# Patient Record
Sex: Male | Born: 1965 | Race: White | Hispanic: No | Marital: Married
Health system: Midwestern US, Community
[De-identification: ages and names within clinical notes are randomized; demographics above are authoritative.]

## PROBLEM LIST (undated history)

## (undated) DIAGNOSIS — F32A Depression, unspecified: Secondary | ICD-10-CM

## (undated) DIAGNOSIS — G4730 Sleep apnea, unspecified: Secondary | ICD-10-CM

## (undated) DIAGNOSIS — R011 Cardiac murmur, unspecified: Secondary | ICD-10-CM

## (undated) DIAGNOSIS — M545 Low back pain, unspecified: Secondary | ICD-10-CM

## (undated) DIAGNOSIS — R51 Headache: Secondary | ICD-10-CM

## (undated) DIAGNOSIS — M1990 Unspecified osteoarthritis, unspecified site: Secondary | ICD-10-CM

## (undated) DIAGNOSIS — I10 Essential (primary) hypertension: Secondary | ICD-10-CM

## (undated) DIAGNOSIS — M5410 Radiculopathy, site unspecified: Secondary | ICD-10-CM

## (undated) DIAGNOSIS — H9192 Unspecified hearing loss, left ear: Secondary | ICD-10-CM

## (undated) DIAGNOSIS — F419 Anxiety disorder, unspecified: Secondary | ICD-10-CM

## (undated) DIAGNOSIS — R519 Headache, unspecified: Secondary | ICD-10-CM

## (undated) DIAGNOSIS — Z8489 Family history of other specified conditions: Secondary | ICD-10-CM

## (undated) DIAGNOSIS — Z9889 Other specified postprocedural states: Secondary | ICD-10-CM

## (undated) DIAGNOSIS — R112 Nausea with vomiting, unspecified: Secondary | ICD-10-CM

## (undated) DIAGNOSIS — F329 Major depressive disorder, single episode, unspecified: Secondary | ICD-10-CM

## (undated) DIAGNOSIS — S83242A Other tear of medial meniscus, current injury, left knee, initial encounter: Secondary | ICD-10-CM

## (undated) HISTORY — DX: Radiculopathy, site unspecified: M54.10

## (undated) HISTORY — PX: BILATERAL KNEE ARTHROSCOPY: SUR91

## (undated) HISTORY — PX: BACK SURGERY: SHX140

## (undated) HISTORY — DX: Low back pain, unspecified: M54.50

## (undated) HISTORY — PX: CERVICAL SPINE SURGERY: SHX589

## (undated) HISTORY — PX: OTHER SURGICAL HISTORY: SHX169

---
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
## 2016-12-28 ENCOUNTER — Ambulatory Visit

## 2016-12-28 ENCOUNTER — Inpatient Hospital Stay

## 2016-12-28 LAB — POTASSIUM: Potassium: 3.5 mmol/L (ref 3.5–5.3)

## 2016-12-28 MED ORDER — SODIUM CHLORIDE 0.9 % IJ SYRG
INTRAMUSCULAR | Status: DC | PRN
Start: 2016-12-28 — End: 2016-12-28

## 2016-12-28 MED ORDER — PROPOFOL 10 MG/ML IV EMUL
10 mg/mL | INTRAVENOUS | Status: AC
Start: 2016-12-28 — End: ?

## 2016-12-28 MED ORDER — FENTANYL CITRATE (PF) 50 MCG/ML IJ SOLN
50 mcg/mL | INTRAMUSCULAR | Status: DC | PRN
Start: 2016-12-28 — End: 2016-12-28

## 2016-12-28 MED ORDER — ONDANSETRON (PF) 4 MG/2 ML INJECTION
4 mg/2 mL | Freq: Once | INTRAMUSCULAR | Status: DC
Start: 2016-12-28 — End: 2016-12-28

## 2016-12-28 MED ORDER — LIDOCAINE (PF) 20 MG/ML (2 %) IV SYRINGE
100 mg/5 mL (2 %) | INTRAVENOUS | Status: AC
Start: 2016-12-28 — End: ?

## 2016-12-28 MED ORDER — LACTATED RINGERS IV
INTRAVENOUS | Status: DC
Start: 2016-12-28 — End: 2016-12-28
  Administered 2016-12-28: 16:00:00 via INTRAVENOUS

## 2016-12-28 MED ORDER — MIDAZOLAM 1 MG/ML IJ SOLN
1 mg/mL | INTRAMUSCULAR | Status: AC
Start: 2016-12-28 — End: ?

## 2016-12-28 MED ORDER — FENTANYL CITRATE (PF) 50 MCG/ML IJ SOLN
50 mcg/mL | INTRAMUSCULAR | Status: DC | PRN
Start: 2016-12-28 — End: 2016-12-28
  Administered 2016-12-28 (×4): via INTRAVENOUS

## 2016-12-28 MED ORDER — FENTANYL CITRATE (PF) 50 MCG/ML IJ SOLN
50 mcg/mL | INTRAMUSCULAR | Status: AC
Start: 2016-12-28 — End: ?

## 2016-12-28 MED ORDER — SODIUM CHLORIDE 0.9 % IV PIGGY BACK
1 gram | Freq: Once | INTRAVENOUS | Status: AC
Start: 2016-12-28 — End: 2016-12-28
  Administered 2016-12-28: 19:00:00 via INTRAVENOUS

## 2016-12-28 MED ORDER — TRIAMCINOLONE ACETONIDE 40 MG/ML SUSP FOR INJECTION
40 mg/mL | INTRAMUSCULAR | Status: DC | PRN
Start: 2016-12-28 — End: 2016-12-28
  Administered 2016-12-28: 19:00:00

## 2016-12-28 MED ORDER — SODIUM CHLORIDE 0.9 % IRRIGATION SOLN (ONE-STEP) - 3000 ML
0.9 % | Status: DC | PRN
Start: 2016-12-28 — End: 2016-12-28
  Administered 2016-12-28: 19:00:00

## 2016-12-28 MED ORDER — BUPIVACAINE-EPINEPHRINE (PF) 0.5 %-1:200,000 IJ SOLN
0.5 %-1:200,000 | INTRAMUSCULAR | Status: DC | PRN
Start: 2016-12-28 — End: 2016-12-28
  Administered 2016-12-28: 19:00:00

## 2016-12-28 MED ORDER — SODIUM CHLORIDE 0.9 % IJ SYRG
Freq: Three times a day (TID) | INTRAMUSCULAR | Status: DC
Start: 2016-12-28 — End: 2016-12-28

## 2016-12-28 MED ORDER — PROPOFOL 10 MG/ML IV EMUL
10 mg/mL | INTRAVENOUS | Status: DC | PRN
Start: 2016-12-28 — End: 2016-12-28
  Administered 2016-12-28: 19:00:00 via INTRAVENOUS

## 2016-12-28 MED ORDER — LIDOCAINE (PF) 20 MG/ML (2 %) IV SYRINGE
100 mg/5 mL (2 %) | INTRAVENOUS | Status: DC | PRN
Start: 2016-12-28 — End: 2016-12-28
  Administered 2016-12-28: 19:00:00 via INTRAVENOUS

## 2016-12-28 MED ORDER — MIDAZOLAM 1 MG/ML IJ SOLN
1 mg/mL | Freq: Once | INTRAMUSCULAR | Status: AC
Start: 2016-12-28 — End: 2016-12-28
  Administered 2016-12-28: 19:00:00 via INTRAVENOUS

## 2016-12-28 MED FILL — DIPRIVAN 10 MG/ML INTRAVENOUS EMULSION: 10 mg/mL | INTRAVENOUS | Qty: 20

## 2016-12-28 MED FILL — CEFAZOLIN 1 GRAM IV SOLUTION: 1 gram | INTRAVENOUS | Qty: 1000

## 2016-12-28 MED FILL — NORMAL SALINE FLUSH 0.9 % INJECTION SYRINGE: INTRAMUSCULAR | Qty: 10

## 2016-12-28 MED FILL — LACTATED RINGERS IV: INTRAVENOUS | Qty: 1000

## 2016-12-28 MED FILL — MIDAZOLAM 1 MG/ML IJ SOLN: 1 mg/mL | INTRAMUSCULAR | Qty: 2

## 2016-12-28 MED FILL — FENTANYL CITRATE (PF) 50 MCG/ML IJ SOLN: 50 mcg/mL | INTRAMUSCULAR | Qty: 2

## 2016-12-28 MED FILL — LIDOCAINE (PF) 20 MG/ML (2 %) IV SYRINGE: 100 mg/5 mL (2 %) | INTRAVENOUS | Qty: 5

## 2016-12-28 NOTE — Op Note (Signed)
Operative Note      Patient: Riley AlpersChristopher Dusenbury               Sex: male          DOA: @INPADMDT @       Date of Birth:  11/26/1965      Age:  51 y.o.        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]    Postoperative Diagnosis:  TORNMEDIAL AND LATERAL MENISCUS, SYNOVITIS, CHONDROMALACIA    Surgeon: Surgeon(s) and Role:     * Violeta GelinasLaura C Marijo Quizon, DO - Primary    Anesthesia:  General    Procedure:  Procedure(s):  KNEE ARTHROSCOPY LEFT, PARTIAL MEDIAL AND LATERAL MENISECTOMY, SYNOVECTOMY, CHONDROPLASTY      Procedure in Detail: see report    Estimated Blood Loss:  5           Tourniquet Time:  17           Implants: * No implants in log *    Specimens:   ID Type Source Tests Collected by Time Destination   1 : LEFT KNEE SHAVINGS Preservative Other                  Violeta GelinasLaura C Vernel Donlan, DO 12/28/2016 1415 Pathology        Drains: None           Complications:  None           Counts: Sponge and needle counts were correct times two.

## 2016-12-28 NOTE — Op Note (Signed)
Operative Note      Patient: Riley Acosta               Sex: male          DOA: @INPADMDT@       Date of Birth:  08/20/1966      Age:  50 y.o.        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]    Postoperative Diagnosis:  TORNMEDIAL AND LATERAL MENISCUS, SYNOVITIS, CHONDROMALACIA    Surgeon: Surgeon(s) and Role:     * Kaimana Lurz C Oluwaseyi Raffel, DO - Primary    Anesthesia:  General    Procedure:  Procedure(s):  KNEE ARTHROSCOPY LEFT, PARTIAL MEDIAL AND LATERAL MENISECTOMY, SYNOVECTOMY, CHONDROPLASTY      Procedure in Detail: see report    Estimated Blood Loss:  5           Tourniquet Time:  17           Implants: * No implants in log *    Specimens:   ID Type Source Tests Collected by Time Destination   1 : LEFT KNEE SHAVINGS Preservative Other                  Folasade Mooty C Jacorion Klem, DO 12/28/2016 1415 Pathology        Drains: None           Complications:  None           Counts: Sponge and needle counts were correct times two.

## 2016-12-28 NOTE — Anesthesia Pre-Procedure Evaluation (Signed)
Anesthetic History   No history of anesthetic complications            Review of Systems / Medical History  Patient summary reviewed, nursing notes reviewed and pertinent labs reviewed    Pulmonary        Sleep apnea           Neuro/Psych   Within defined limits           Cardiovascular    Hypertension: well controlled              Exercise tolerance: >4 METS     GI/Hepatic/Renal  Within defined limits              Endo/Other  Within defined limits           Other Findings              Physical Exam    Airway  Mallampati: II  TM Distance: 4 - 6 cm  Neck ROM: normal range of motion   Mouth opening: Normal     Cardiovascular  Regular rate and rhythm,  S1 and S2 normal,  no murmur, click, rub, or gallop  Rhythm: regular  Rate: normal         Dental  No notable dental hx       Pulmonary  Breath sounds clear to auscultation               Abdominal  GI exam deferred       Other Findings            Anesthetic Plan    ASA: 2  Anesthesia type: general          Induction: Intravenous  Anesthetic plan and risks discussed with: Patient

## 2016-12-28 NOTE — Op Note (Signed)
[facility]  OPERATIVE REPORT    [name], Riley  MR#: [id number]  DOB: [birth year]  ACCOUNT #: [id number]   DATE OF SERVICE: 12/28/2016    PREOPERATIVE DIAGNOSIS: Left knee internal derangement with synovitis and torn meniscus.    POSTOPERATIVE DIAGNOSIS:  Left knee internal derangement with loose bodies medial compartment, chondromalacia, torn posterior horn medial meniscus, torn anterior horn mid portion lateral meniscus.    PROCEDURES PERFORMED: Left knee diagnostic and operative arthroscopy with synovectomy and patellofemoral medial, lateral, and anterior compartments, partial medial meniscectomy and posterior horn removal of loose bodies under no separate incision, medial compartment debridement of a large lateral meniscal tear, anterior horn and mid portion, chondroplasty to bleeding subchondral bone/patella.    SURGEON:  Annitta JerseyLaura Daizha Anand, DO     ANESTHESIA:  General, local.    ASSISTANT:  None.    IMPLANTS:  None.    DRAINS:  None.    INDICATIONS:  A 51 year old with persistent pain, swelling and giving out of the knee, failed conservative treatment.  Benefits and risks of surgery to include but not limited to bleeding, infection, neurovascular damage, arthrofibrosis, DVT, PE, incisional pain, numbness and/or neuroma formation, persistent, altered or increased symptoms, potential need for further surgical intervention as well as postoperative rehab time were reviewed with the patient, questions answered and proper consents were signed.       DESCRIPTION OF PROCEDURE:  The patient was taken to the operating room and properly identified.  The patient was given anesthesia as described above.  The left lower extremity was prepped and draped in the usual sterile fashion.  Standard arthroscopic portals were used.       Patellofemoral joint:  Patellofemoral joint with some grade III and IV chondromalacia in the undersurface of the patella, which was tracking well  in trochlear groove.   Hemorrhagic synovium suprapatellar pouch. Procedure performed: Chondroplasty to bleeding subchondral bone and synovectomy was performed in this compartment.     Medial compartment:  Medial compartment with tear of the posterior horn under the medial meniscus.  Grade II and III chondromalacia along the medial tibial plateau.  Loose bodies or fragments of articular cartilage floating in the joint.  Procedure performed:  Partial medial meniscectomy, soft chondroplasty and removal of loose bodies with no separate incision.      Notch:  The notch was covered with synovium.  There was some narrowing of the notch.  ACL and PCL were intact.  Procedure performed in the notch: Synovectomy.     Lateral compartment:  Lateral compartment with a complex degenerative tear from the 3:00 to the 6:30 position of the lateral meniscus, grade II chondromalacia and synovitis.  Procedure performed:  Partial lateral meniscectomy, soft chondroplasty and synovectomy.  Steroid was injected.    The knee was irrigated and drained through the scope.  Pre and postoperative photos were taken.  All remaining articular cartilage and meniscal tissue was probed and found to be stable.  Portals closed with 4-0 Nylon and injected with 0.25% Marcaine with Epinephrine.  The knee was injected with 20 mL of 0.25% Marcaine with Epinephrine without 1 mL of Kenalog.  Betadine soaked Adaptic 4X4's, ABD's and sterile dressing were applied.  The patient was transferred to the Recovery Room in stable condition.     COMPLICATIONS:  None.    ESTIMATED BLOOD LOSS:    SPECIMENS REMOVED:      Christine Morton, D.O.       LR / HN  D: 12/30/2016 12:56  T: 12/30/2016 14:16  JOB #: [id number]

## 2016-12-28 NOTE — Anesthesia Post-Procedure Evaluation (Signed)
Post-Anesthesia Evaluation and Assessment    Patient: Riley AlpersChristopher Acosta MRN: [id number]  SSN: [id number]    Date of Birth: 07/21/1966  Age: 51 y.o.  Sex: male       Cardiovascular Function/Vital Signs  Visit Vitals   ??? BP 131/85   ??? Pulse 88   ??? Temp 36.5 ??C (97.7 ??F)   ??? Resp 17   ??? Ht 5\' 9"  (1.753 m)   ??? Wt 92.5 kg (204 lb)   ??? SpO2 100%   ??? BMI 30.13 kg/m2       Patient is status post general anesthesia for Procedure(s):  KNEE ARTHROSCOPY LEFT, PARTIAL MEDIAL AND LATERAL MENISECTOMY, SYNOVECTOMY, CHONDROPLASTY .    Nausea/Vomiting: None    Postoperative hydration reviewed and adequate.    Pain:  Pain Scale 1: Visual (12/28/16 1430)  Pain Intensity 1: 0 (12/28/16 1430)   Managed    Neurological Status:   Neuro (WDL): Within Defined Limits (12/28/16 1033)   At baseline    Mental Status and Level of Consciousness: Arousable    Pulmonary Status:   O2 Device: Oxygen mask;Oral airway (12/28/16 1432)   Adequate oxygenation and airway patent    Complications related to anesthesia: None    Post-anesthesia assessment completed. No concerns    Signed By: Brayton CavesAshley B Davianna Deutschman, MD     December 28, 2016

## 2016-12-28 NOTE — Op Note (Signed)
OUR [facility]  OPERATIVE REPORT    LONNEY, REVAK  MR#: [id number]  DOB: [birth year]  ACCOUNT #: [id number]   DATE OF SERVICE: 12/28/2016    PREOPERATIVE DIAGNOSIS: Left knee internal derangement with synovitis and torn meniscus.    POSTOPERATIVE DIAGNOSIS:  Left knee internal derangement with loose bodies medial compartment, chondromalacia, torn posterior horn medial meniscus, torn anterior horn mid portion lateral meniscus.    PROCEDURES PERFORMED: Left knee diagnostic and operative arthroscopy with synovectomy and patellofemoral medial, lateral, and anterior compartments, partial medial meniscectomy and posterior horn removal of loose bodies under no separate incision, medial compartment debridement of a large lateral meniscal tear, anterior horn and mid portion, chondroplasty to bleeding subchondral bone/patella.    SURGEON:  Annitta Jersey, DO     ANESTHESIA:  General, local.    ASSISTANT:  None.    IMPLANTS:  None.    DRAINS:  None.    INDICATIONS:  A 51 year old with persistent pain, swelling and giving out of the knee, failed conservative treatment.  Benefits and risks of surgery to include but not limited to bleeding, infection, neurovascular damage, arthrofibrosis, DVT, PE, incisional pain, numbness and/or neuroma formation, persistent, altered or increased symptoms, potential need for further surgical intervention as well as postoperative rehab time were reviewed with the patient, questions answered and proper consents were signed.       DESCRIPTION OF PROCEDURE:  The patient was taken to the operating room and properly identified.  The patient was given anesthesia as described above.  The left lower extremity was prepped and draped in the usual sterile fashion.  Standard arthroscopic portals were used.       Patellofemoral joint:  Patellofemoral joint with some grade III and IV chondromalacia in the undersurface of the patella, which was tracking well in trochlear groove.    Hemorrhagic synovium suprapatellar pouch. Procedure performed: Chondroplasty to bleeding subchondral bone and synovectomy was performed in this compartment.     Medial compartment:  Medial compartment with tear of the posterior horn under the medial meniscus.  Grade II and III chondromalacia along the medial tibial plateau.  Loose bodies or fragments of articular cartilage floating in the joint.  Procedure performed:  Partial medial meniscectomy, soft chondroplasty and removal of loose bodies with no separate incision.      Notch:  The notch was covered with synovium.  There was some narrowing of the notch.  ACL and PCL were intact.  Procedure performed in the notch: Synovectomy.     Lateral compartment:  Lateral compartment with a complex degenerative tear from the 3:00 to the 6:30 position of the lateral meniscus, grade II chondromalacia and synovitis.  Procedure performed:  Partial lateral meniscectomy, soft chondroplasty and synovectomy.  Steroid was injected.    The knee was irrigated and drained through the scope.  Pre and postoperative photos were taken.  All remaining articular cartilage and meniscal tissue was probed and found to be stable.  Portals closed with 4-0 Nylon and injected with 0.25% Marcaine with Epinephrine.  The knee was injected with 20 mL of 0.25% Marcaine with Epinephrine without 1 mL of Kenalog.  Betadine soaked Adaptic 4X4's, ABD's and sterile dressing were applied.  The patient was transferred to the Recovery Room in stable condition.     COMPLICATIONS:  None.    ESTIMATED BLOOD LOSS:    SPECIMENS REMOVED:      Destry Bezdek, D.O.       LR / HN  D: 12/30/2016 12:56  T: 12/30/2016 14:16  JOB #: [id number]

## 2017-04-02 ENCOUNTER — Encounter: Payer: Self-pay | Admitting: Gastroenterology

## 2017-04-26 ENCOUNTER — Ambulatory Visit (AMBULATORY_SURGERY_CENTER): Payer: Self-pay

## 2017-04-26 VITALS — Ht 68.5 in | Wt 207.8 lb

## 2017-04-26 DIAGNOSIS — Z1211 Encounter for screening for malignant neoplasm of colon: Secondary | ICD-10-CM

## 2017-04-26 MED ORDER — SUPREP BOWEL PREP KIT 17.5-3.13-1.6 GM/177ML PO SOLN: 1.0000 | Freq: Once | ORAL | 0 refills | Status: AC

## 2017-04-26 NOTE — Progress Notes (Signed)
No allergies to eggs or soy No diet meds No home xoygen No past problems with anesthesia  Registered emmi

## 2017-04-27 ENCOUNTER — Encounter: Payer: Self-pay | Admitting: *Deleted

## 2017-04-27 ENCOUNTER — Telehealth: Payer: Self-pay | Admitting: Gastroenterology

## 2017-04-27 NOTE — Telephone Encounter (Signed)
Spoke with patient and I will send him the letter for the [place] today.

## 2017-04-28 ENCOUNTER — Telehealth: Payer: Self-pay | Admitting: Gastroenterology

## 2017-04-28 DIAGNOSIS — Z1211 Encounter for screening for malignant neoplasm of colon: Secondary | ICD-10-CM

## 2017-05-04 MED ORDER — NA SULFATE-K SULFATE-MG SULF 17.5-3.13-1.6 GM/177ML PO SOLN: 1.0000 | Freq: Once | ORAL | 0 refills | Status: DC

## 2017-05-04 MED ORDER — NA SULFATE-K SULFATE-MG SULF 17.5-3.13-1.6 GM/177ML PO SOLN: 1.0000 | Freq: Once | ORAL | 0 refills | Status: AC

## 2017-05-04 NOTE — Telephone Encounter (Signed)
Spoke with pt. He is requesting a letter for the [place] showing he was seen in our office for a nurse's visit on 04/26/17. He states he is also having trouble getting the Suprep Rx. The pt will come to our office today to pick up the letter for the [facility] hospital and a Suprep sample will be left for the pt to pick up. Suprep sample, Lot # [id number], Exp 05/20 and a letter were left at the front desk on the 4th floor for the pt to pick up. He will call back if he has further questions.

## 2017-05-04 NOTE — Telephone Encounter (Signed)
Patient states that pharmacy did not receive rx. Patient is requesting this Rx to be mailed to him.   Patient also states that the [place] is needing proof that patient came to his pre visit appointment.

## 2017-05-10 ENCOUNTER — Ambulatory Visit (AMBULATORY_SURGERY_CENTER): Payer: Non-veteran care | Admitting: Gastroenterology

## 2017-05-10 ENCOUNTER — Encounter: Payer: Self-pay | Admitting: Gastroenterology

## 2017-05-10 VITALS — BP 125/75 | HR 72 | Temp 98.9°F | Resp 13 | Ht 68.5 in | Wt 207.0 lb

## 2017-05-10 DIAGNOSIS — Z1211 Encounter for screening for malignant neoplasm of colon: Secondary | ICD-10-CM

## 2017-05-10 DIAGNOSIS — Z1212 Encounter for screening for malignant neoplasm of rectum: Secondary | ICD-10-CM | POA: Diagnosis not present

## 2017-05-10 DIAGNOSIS — D123 Benign neoplasm of transverse colon: Secondary | ICD-10-CM

## 2017-05-10 DIAGNOSIS — K635 Polyp of colon: Secondary | ICD-10-CM | POA: Insufficient documentation

## 2017-05-10 MED ORDER — SODIUM CHLORIDE 0.9 % IV SOLN
500.0000 mL | INTRAVENOUS | Status: DC
Start: 2017-05-10 — End: 2017-07-16

## 2017-05-10 NOTE — Patient Instructions (Signed)
YOU HAD AN ENDOSCOPIC PROCEDURE TODAY AT THE [facility]:   Refer to the procedure report that was given to you for any specific questions about what was found during the examination.  If the procedure report does not answer your questions, please call your gastroenterologist to clarify.  If you requested that your care partner not be given the details of your procedure findings, then the procedure report has been included in a sealed envelope for you to review at your convenience later.  YOU SHOULD EXPECT: Some feelings of bloating in the abdomen. Passage of more gas than usual.  Walking can help get rid of the air that was put into your GI tract during the procedure and reduce the bloating. If you had a lower endoscopy (such as a colonoscopy or flexible sigmoidoscopy) you may notice spotting of blood in your stool or on the toilet paper. If you underwent a bowel prep for your procedure, you may not have a normal bowel movement for a few days.  Please Note:  You might notice some irritation and congestion in your nose or some drainage.  This is from the oxygen used during your procedure.  There is no need for concern and it should clear up in a day or so.  SYMPTOMS TO REPORT IMMEDIATELY:   Following lower endoscopy (colonoscopy or flexible sigmoidoscopy):  Excessive amounts of blood in the stool  Significant tenderness or worsening of abdominal pains  Swelling of the abdomen that is new, acute  Fever of 100F or higher    For urgent or emergent issues, a gastroenterologist can be reached at any hour by calling [phone].   DIET:  We do recommend a small meal at first, but then you may proceed to your regular diet.  Drink plenty of fluids but you should avoid alcoholic beverages for 24 hours.  ACTIVITY:  You should plan to take it easy for the rest of today and you should NOT DRIVE or use heavy machinery until tomorrow (because of the sedation medicines used during the test).     FOLLOW UP: Our staff will call the number listed on your records the next business day following your procedure to check on you and address any questions or concerns that you may have regarding the information given to you following your procedure. If we do not reach you, we will leave a message.  However, if you are feeling well and you are not experiencing any problems, there is no need to return our call.  We will assume that you have returned to your regular daily activities without incident.  If any biopsies were taken you will be contacted by phone or by letter within the next 1-3 weeks.  Please call us at [phone] if you have not heard about the biopsies in 3 weeks.    SIGNATURES/CONFIDENTIALITY: You and/or your care partner have signed paperwork which will be entered into your electronic medical record.  These signatures attest to the fact that that the information above on your After Visit Summary has been reviewed and is understood.  Full responsibility of the confidentiality of this discharge information lies with you and/or your care-partner.    Handouts were given to your care partner on polyps and diverticulosis. You may resume your current medications today. Await biopsy results. Please call if any questions or concerns.   

## 2017-05-10 NOTE — Progress Notes (Signed)
Report given to PACU, vss 

## 2017-05-10 NOTE — Op Note (Signed)
[place] [name] Procedure Date: 05/10/2017 2:35 PM MRN: [id number] Endoscopist: Mallie Mussel L. Loletha Carrow , MD Age: 51 Referring MD:  Date of Birth: 09-22-1966 Gender: Male Account #: [id number] Procedure:                Colonoscopy Indications:              Screening for colorectal malignant neoplasm, This                            is the patient's first colonoscopy Medicines:                Monitored Anesthesia Care Procedure:                Pre-Anesthesia Assessment:                           - Prior to the procedure, a History and Physical                            was performed, and patient medications and                            allergies were reviewed. The patient's tolerance of                            previous anesthesia was also reviewed. The risks                            and benefits of the procedure and the sedation                            options and risks were discussed with the patient.                            All questions were answered, and informed consent                            was obtained. Prior Anticoagulants: The patient has                            taken no previous anticoagulant or antiplatelet                            agents. ASA Grade Assessment: II - A patient with                            mild systemic disease. After reviewing the risks                            and benefits, the patient was deemed in                            satisfactory condition to undergo the procedure.  After obtaining informed consent, the colonoscope                            was passed under direct vision. Throughout the                            procedure, the patient's blood pressure, pulse, and                            oxygen saturations were monitored continuously. The                            Colonoscope was introduced through the anus and                            advanced to the the  cecum, identified by                            appendiceal orifice and ileocecal valve. The                            colonoscopy was performed without difficulty. The                            patient tolerated the procedure well. The quality                            of the bowel preparation was good. The ileocecal                            valve, appendiceal orifice, and rectum were                            photographed. The quality of the bowel preparation                            was evaluated using the BBPS [facility] Bowel                            Preparation Scale) with scores of: Right Colon = 2,                            Transverse Colon = 2 and Left Colon = 2. The total                            BBPS score equals 6. The bowel preparation used was                            SUPREP. Scope In: 2:46:34 PM Scope Out: 3:01:00 PM Scope Withdrawal Time: 0 hours 10 minutes 58 seconds  Total Procedure Duration: 0 hours 14 minutes 26 seconds  Findings:                 The perianal and digital rectal  examinations were                            normal.                           A 2 mm polyp was found in the hepatic flexure. The                            polyp was sessile. The polyp was removed with a                            cold snare. Resection and retrieval were complete.                           Multiple medium-mouthed diverticula were found in                            the left colon and right colon.                           The exam was otherwise without abnormality on                            direct and retroflexion views. Complications:            No immediate complications. Estimated Blood Loss:     Estimated blood loss: none. Impression:               - One 2 mm polyp at the hepatic flexure, removed                            with a cold snare. Resected and retrieved.                           - Diverticulosis in the left colon and in the right                             colon.                           - The examination was otherwise normal on direct                            and retroflexion views. Recommendation:           - Patient has a contact number available for                            emergencies. The signs and symptoms of potential                            delayed complications were discussed with the                            patient. Return to normal activities tomorrow.  Written discharge instructions were provided to the                            patient.                           - Resume previous diet.                           - Continue present medications.                           - Await pathology results.                           - Repeat colonoscopy is recommended for                            surveillance. The colonoscopy date will be                            determined after pathology results from today's                            exam become available for review. Markitta Ausburn L. Loletha Carrow, MD 05/10/2017 3:03:54 PM This report has been signed electronically.

## 2017-05-10 NOTE — Progress Notes (Signed)
Pt's states no medical or surgical changes since previsit or office visit. 

## 2017-05-10 NOTE — Progress Notes (Signed)
No problems noted in the recovery room. maw 

## 2017-05-11 ENCOUNTER — Telehealth: Payer: Self-pay | Admitting: *Deleted

## 2017-05-11 NOTE — Telephone Encounter (Signed)
  Follow up Call-  Call back number 05/10/2017  Post procedure Call Back phone  # [phone]  Permission to leave phone message Yes     Patient questions:  Do you have a fever, pain , or abdominal swelling? No. Pain Score  0 *  Have you tolerated food without any problems? Yes.    Have you been able to return to your normal activities? Yes.    Do you have any questions about your discharge instructions: Diet   No. Medications  No. Follow up visit  No.  Do you have questions or concerns about your Care? No.  Actions: * If pain score is 4 or above: No action needed, pain <4.

## 2017-05-13 ENCOUNTER — Encounter: Payer: Self-pay | Admitting: Gastroenterology

## 2017-05-27 ENCOUNTER — Other Ambulatory Visit: Payer: Self-pay | Admitting: Specialist

## 2017-05-27 DIAGNOSIS — M5136 Other intervertebral disc degeneration, lumbar region: Secondary | ICD-10-CM

## 2017-06-08 ENCOUNTER — Other Ambulatory Visit: Payer: No Typology Code available for payment source

## 2017-06-08 NOTE — Discharge Instructions (Signed)

## 2017-06-10 ENCOUNTER — Ambulatory Visit
Admission: RE | Admit: 2017-06-10 | Discharge: 2017-06-10 | Disposition: A | Discharge status: Home / Self Care | Payer: Self-pay | Source: Ambulatory Visit | Attending: Specialist | Admitting: Specialist

## 2017-06-10 ENCOUNTER — Other Ambulatory Visit: Payer: Self-pay | Admitting: Specialist

## 2017-06-10 ENCOUNTER — Ambulatory Visit
Admission: RE | Admit: 2017-06-10 | Discharge: 2017-06-10 | Disposition: A | Discharge status: Home / Self Care | Payer: No Typology Code available for payment source | Source: Ambulatory Visit | Attending: Specialist | Admitting: Specialist

## 2017-06-10 DIAGNOSIS — M5136 Other intervertebral disc degeneration, lumbar region: Secondary | ICD-10-CM

## 2017-06-10 MED ORDER — IOPAMIDOL (ISOVUE-M 200) INJECTION 41%
15.0000 mL | Freq: Once | INTRAMUSCULAR | Status: AC
  Administered 2017-06-10: 15 mL via INTRATHECAL

## 2017-06-10 MED ORDER — ONDANSETRON HCL 4 MG/2ML IJ SOLN
4.0000 mg | Freq: Once | INTRAMUSCULAR | Status: AC
  Administered 2017-06-10: 4 mg via INTRAMUSCULAR

## 2017-06-10 MED ORDER — MEPERIDINE HCL 100 MG/ML IJ SOLN
75.0000 mg | Freq: Once | INTRAMUSCULAR | Status: AC
  Administered 2017-06-10: 75 mg via INTRAMUSCULAR

## 2017-06-10 MED ORDER — DIAZEPAM 5 MG PO TABS
10.0000 mg | ORAL_TABLET | Freq: Once | ORAL | Status: AC
  Administered 2017-06-10: 10 mg via ORAL

## 2017-06-17 ENCOUNTER — Ambulatory Visit: Payer: Self-pay | Admitting: Orthopedic Surgery

## 2017-06-17 NOTE — H&P (Signed)
[name] is an 51 y.o. male.   Chief Complaint: back and leg pain HPI: The patient is a 51 year old male who presents today for follow up of their back. The patient is being followed for their central back pain (pain radiates down both legs, further down the left leg). They are now year(s) out from injury. Symptoms reported today include: pain. The patient states that they are doing poorly. The following medication has been used for pain control: none. The patient reports their current pain level to be severe (varies from mild to severe, usually averages around a 7). The patient presents today following myelogram. Note for "Follow-up back": Had alot of discomfort in low back and bilat knees after the Myelogram.  [name] reports bilateral leg pain, multi-year duration. He has to sit for relief. He has limited ambulatory capacity, less than a block, both legs. Has to sit for relief. He has a myelogram and is here for review. He has minimal back pain, mainly buttock and leg pain.  No change in bowel or bladder function.  No past medical history on file.  Past Surgical History:  Procedure Laterality Date  . BILATERAL KNEE ARTHROSCOPY    . c5-6     removal of fusion and spacers  . CERVICAL SPINE SURGERY     2015,OCTOBER    Family History  Problem Relation Age of Onset  . Hepatitis Mother   . Colon cancer Neg Hx    Social History:  reports that he has quit smoking. His smoking use included Cigarettes. He has never used smokeless tobacco. He reports that he drinks about 8.4 oz of alcohol per week . He reports that he does not use drugs.  Allergies:  Allergies  Allergen Reactions  . Lisinopril Swelling and Rash  . Tramadol Other (See Comments)    "complete blackout;" can't remember a thing     (Not in a hospital admission)  No results found for this or any previous visit (from the past 48 hour(s)). No results found.  Review of Systems  Constitutional:  Negative.   HENT: Negative.   Eyes: Negative.   Respiratory: Negative.   Cardiovascular: Negative.   Gastrointestinal: Negative.   Genitourinary: Negative.   Musculoskeletal: Positive for back pain.  Skin: Negative.   Neurological: Positive for sensory change and focal weakness.  Psychiatric/Behavioral: Negative.     There were no vitals taken for this visit. Physical Exam  Constitutional: He is oriented to person, place, and time. He appears well-developed. He appears distressed.  HENT:  Head: Normocephalic.  Eyes: Pupils are equal, round, and reactive to light.  Neck: Normal range of motion.  Cardiovascular: Normal rate.   Respiratory: Effort normal.  GI: Soft.  Musculoskeletal:  On exam, moderate distress. He walks with an antalgic forward-flexed gait with a cane. He has bilateral knee braces on. Straight leg raise with buttock, thigh and calf pain on the left, buttock pain on the right. He has 4+/5 EHL on the left, 5-/5 on the right. Slight quad weakness left compared to the right. Diminished Achilles reflex on the left compared to the right. Dysesthesia in the plantar aspect of the foot. Diminished repetitive plantar flexion.  Lumbar spine exam reveals no evidence of soft tissue swelling, deformity or skin ecchymosis. On palpation there is no tenderness of the lumbar spine. No flank pain with percussion. The abdomen is soft and nontender. Nontender over the trochanters. No cellulitis or lymphadenopathy.  Good range of motion of the  lumbar spine without associated pain. Straight leg raise is negative. Motor is 5/5 including EHL, tibialis anterior, plantar flexion, quadriceps and hamstrings. Patient is normoreflexic. There is no Babinski or clonus. Sensory exam is intact to light touch. Patient has good distal pulses. No DVT. No pain and normal range of motion without instability of the hips, knees and ankles.  Cervical spine, he has some discomfort with end-forward flexion. He is  status post two-level cervical fusion.  Neurological: He is alert and oriented to person, place, and time.    Myelogram was reviewed and it shows severe spinal stenosis at L4-L5. Into L5-S1, he has congenitally short pedicles. He has listhesis at L5 due to the facets but no pars defect. No instability in flexion and extension.  Again, his MRI demonstrates severe stenosis at L4-L5 extending up above near to the pedicle of L4 and below L5.  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 It is offset at L5-S1 but no instability due to the facet arthropathy. He has a tall disc. He is not unstable.  We discussed options; living with the symptoms in forward flexion, [place] program. Surgical options were discussed, I do feel with at least decompression at L4-L5, also at L3-L4 and L5-S1. We will remove the neural arch at L5 and L4. I did not feel that he needs concomitant stabilization, tall disc, not unstable. He has mild degeneration and facet arthropathy. We discussed this is not an operation for back pain but to give him leg pain relief and his claudication and that he may need a fusion in the future. We discussed this in extensive detail. I had an extensive discussion of the risks and benefits of the lumbar decompression with the patient including bleeding, infection, damage to neurovascular structures, epidural fibrosis, CSF leak requiring repair. We also discussed increase in pain, adjacent segment disease, recurrent disc herniation, need for future surgery including repeat decompression and/or fusion. We also discussed risks of postoperative hematoma, paralysis, anesthetic complications including DVT, PE, death, cardiopulmonary dysfunction. In addition, the perioperative and postoperative courses were discussed in detail including the rehabilitative time and  return to functional activity and work. I provided the patient with an illustrated handout and utilized the appropriate surgical models. No history of DVT, MRSA. He is otherwise healthy. Overnight in the hospital. Follow up in three to four weeks.  Plan microlumbar decompression L4-5, possible L3-4, possible L5-S1  Tomeeka Plaugher, Conley Rolls., PA-C for Dr. Tonita Cong 06/17/2017, 11:02 AM

## 2017-07-05 ENCOUNTER — Other Ambulatory Visit (HOSPITAL_COMMUNITY): Payer: Self-pay | Admitting: Emergency Medicine

## 2017-07-05 NOTE — Progress Notes (Signed)
EKG 09-24-17 on chart   [address] PA in [place] 11-25-16 on chart

## 2017-07-05 NOTE — Patient Instructions (Signed)
[name]  07/05/2017   Your procedure is scheduled on: 07-14-17  Report to [facility] Main  Entrance Take [place]  elevators to 3rd floor to  [place] at [place].    Call this number if you have problems the morning of surgery [phone]    Remember: ONLY 1 PERSON MAY GO WITH YOU TO SHORT STAY TO GET  READY MORNING OF [place].  Do not eat food or drink liquids :After Midnight.   Bring CPAP mask and tubing only  to the hospital. The device will be provided for you !   Take these medicines the morning of surgery with A SIP OF WATER: none                                You may not have any metal on your body including hair pins and              piercings  Do not wear jewelry, make-up, lotions, powders or perfumes, deodorant                Men may shave face and neck.   Do not bring valuables to the hospital. [place].  Contacts, dentures or bridgework may not be worn into surgery.  Leave suitcase in the car. After surgery it may be brought to your room.                Please read over the following fact sheets you were given: _____________________________________________________________________    [facility] - Preparing for Surgery Before surgery, you can play an important role.  Because skin is not sterile, your skin needs to be as free of germs as possible.  You can reduce the number of germs on your skin by washing with CHG (chlorahexidine gluconate) soap before surgery.  CHG is an antiseptic cleaner which kills germs and bonds with the skin to continue killing germs even after washing. Please DO NOT use if you have an allergy to CHG or antibacterial soaps.  If your skin becomes reddened/irritated stop using the CHG and inform your nurse when you arrive at Short Stay. Do not shave (including legs and underarms) for at least 48 hours prior to the first CHG shower.  You may shave your  face/neck. Please follow these instructions carefully:  1.  Shower with CHG Soap the night before surgery and the  morning of Surgery.  2.  If you choose to wash your hair, wash your hair first as usual with your  normal  shampoo.  3.  After you shampoo, rinse your hair and body thoroughly to remove the  shampoo.                           4.  Use CHG as you would any other liquid soap.  You can apply chg directly  to the skin and wash                       Gently with a scrungie or clean washcloth.  5.  Apply the CHG Soap to your body ONLY FROM THE NECK DOWN.   Do not use on face/ open  Wound or open sores. Avoid contact with eyes, ears mouth and genitals (private parts).                       Wash face,  Genitals (private parts) with your normal soap.             6.  Wash thoroughly, paying special attention to the area where your surgery  will be performed.  7.  Thoroughly rinse your body with warm water from the neck down.  8.  DO NOT shower/wash with your normal soap after using and rinsing off  the CHG Soap.                9.  Pat yourself dry with a clean towel.            10.  Wear clean pajamas.            11.  Place clean sheets on your bed the night of your first shower and do not  sleep with pets. Day of Surgery : Do not apply any lotions/deodorants the morning of surgery.  Please wear clean clothes to the hospital/surgery center.  FAILURE TO FOLLOW THESE INSTRUCTIONS MAY RESULT IN THE CANCELLATION OF YOUR SURGERY PATIENT SIGNATURE_________________________________  NURSE SIGNATURE__________________________________  ________________________________________________________________________   [name]  An incentive spirometer is a tool that can help keep your lungs clear and active. This tool measures how well you are filling your lungs with each breath. Taking long deep breaths may help reverse or decrease the chance of developing breathing  (pulmonary) problems (especially infection) following:  A long period of time when you are unable to move or be active. BEFORE THE PROCEDURE   If the spirometer includes an indicator to show your best effort, your nurse or respiratory therapist will set it to a desired goal.  If possible, sit up straight or lean slightly forward. Try not to slouch.  Hold the incentive spirometer in an upright position. INSTRUCTIONS FOR USE  1. Sit on the edge of your bed if possible, or sit up as far as you can in bed or on a chair. 2. Hold the incentive spirometer in an upright position. 3. Breathe out normally. 4. Place the mouthpiece in your mouth and seal your lips tightly around it. 5. Breathe in slowly and as deeply as possible, raising the piston or the ball toward the top of the column. 6. Hold your breath for 3-5 seconds or for as long as possible. Allow the piston or ball to fall to the bottom of the column. 7. Remove the mouthpiece from your mouth and breathe out normally. 8. Rest for a few seconds and repeat Steps 1 through 7 at least 10 times every 1-2 hours when you are awake. Take your time and take a few normal breaths between deep breaths. 9. The spirometer may include an indicator to show your best effort. Use the indicator as a goal to work toward during each repetition. 10. After each set of 10 deep breaths, practice coughing to be sure your lungs are clear. If you have an incision (the cut made at the time of surgery), support your incision when coughing by placing a pillow or rolled up towels firmly against it. Once you are able to get out of bed, walk around indoors and cough well. You may stop using the incentive spirometer when instructed by your caregiver.  RISKS AND COMPLICATIONS  Take your time so you do not get  dizzy or light-headed.  If you are in pain, you may need to take or ask for pain medication before doing incentive spirometry. It is harder to take a deep breath if you  are having pain. AFTER USE  Rest and breathe slowly and easily.  It can be helpful to keep track of a log of your progress. Your caregiver can provide you with a simple table to help with this. If you are using the spirometer at home, follow these instructions: [place] IF:   You are having difficultly using the spirometer.  You have trouble using the spirometer as often as instructed.  Your pain medication is not giving enough relief while using the spirometer.  You develop fever of 100.5 F (38.1 C) or higher. SEEK IMMEDIATE MEDICAL CARE IF:   You cough up bloody sputum that had not been present before.  You develop fever of 102 F (38.9 C) or greater.  You develop worsening pain at or near the incision site. MAKE SURE YOU:   Understand these instructions.  Will watch your condition.  Will get help right away if you are not doing well or get worse. Document Released: 03/22/2007 Document Revised: 02/01/2012 Document Reviewed: 05/23/2007 [facility] Patient Information 2014 [place], [place].   ________________________________________________________________________

## 2017-07-07 ENCOUNTER — Ambulatory Visit (HOSPITAL_COMMUNITY)
Admission: RE | Admit: 2017-07-07 | Discharge: 2017-07-07 | Disposition: A | Discharge status: Home / Self Care | Payer: Non-veteran care | Source: Ambulatory Visit | Attending: Orthopedic Surgery | Admitting: Orthopedic Surgery

## 2017-07-07 ENCOUNTER — Encounter (HOSPITAL_COMMUNITY)
Admission: RE | Admit: 2017-07-07 | Discharge: 2017-07-07 | Disposition: A | Discharge status: Home / Self Care | Payer: Non-veteran care | Source: Ambulatory Visit | Attending: Specialist | Admitting: Specialist

## 2017-07-07 ENCOUNTER — Encounter (HOSPITAL_COMMUNITY): Payer: Self-pay

## 2017-07-07 ENCOUNTER — Encounter (INDEPENDENT_AMBULATORY_CARE_PROVIDER_SITE_OTHER): Payer: Self-pay

## 2017-07-07 DIAGNOSIS — Z01812 Encounter for preprocedural laboratory examination: Secondary | ICD-10-CM | POA: Diagnosis present

## 2017-07-07 DIAGNOSIS — M4316 Spondylolisthesis, lumbar region: Secondary | ICD-10-CM | POA: Insufficient documentation

## 2017-07-07 DIAGNOSIS — Z01818 Encounter for other preprocedural examination: Secondary | ICD-10-CM | POA: Diagnosis not present

## 2017-07-07 DIAGNOSIS — M5126 Other intervertebral disc displacement, lumbar region: Secondary | ICD-10-CM

## 2017-07-07 DIAGNOSIS — M48061 Spinal stenosis, lumbar region without neurogenic claudication: Secondary | ICD-10-CM | POA: Diagnosis not present

## 2017-07-07 HISTORY — DX: Headache, unspecified: R51.9

## 2017-07-07 HISTORY — DX: Unspecified hearing loss, left ear: H91.92

## 2017-07-07 HISTORY — DX: Essential (primary) hypertension: I10

## 2017-07-07 HISTORY — DX: Headache: R51

## 2017-07-07 HISTORY — DX: Sleep apnea, unspecified: G47.30

## 2017-07-07 HISTORY — DX: Cardiac murmur, unspecified: R01.1

## 2017-07-07 LAB — CBC
HEMATOCRIT: 44.4 % (ref 39.0–52.0)
HEMOGLOBIN: 15.7 g/dL (ref 13.0–17.0)
MCH: 30.6 pg (ref 26.0–34.0)
MCHC: 35.4 g/dL (ref 30.0–36.0)
MCV: 86.5 fL (ref 78.0–100.0)
PLATELETS: 286 10*3/uL (ref 150–400)
RBC: 5.13 MIL/uL (ref 4.22–5.81)
RDW: 13.2 % (ref 11.5–15.5)
WBC: 7.3 10*3/uL (ref 4.0–10.5)

## 2017-07-07 LAB — BASIC METABOLIC PANEL
ANION GAP: 9 (ref 5–15)
BUN: 17 mg/dL (ref 6–20)
CO2: 29 mmol/L (ref 22–32)
CREATININE: 1.05 mg/dL (ref 0.61–1.24)
Calcium: 9.5 mg/dL (ref 8.9–10.3)
Chloride: 99 mmol/L — ABNORMAL LOW (ref 101–111)
GFR calc Af Amer: >60 mL/min (ref >60)
GFR calc non Af Amer: >60 mL/min (ref >60)
Glucose, Bld: 111 mg/dL — ABNORMAL HIGH (ref 65–99)
Potassium: 4.3 mmol/L (ref 3.5–5.1)
Sodium: 137 mmol/L (ref 135–145)

## 2017-07-07 LAB — SURGICAL PCR SCREEN
MRSA, PCR: NEGATIVE
Staphylococcus aureus: NEGATIVE

## 2017-07-08 NOTE — Progress Notes (Signed)
PT CONTACTED TO NOTIFY ABOUT SURGERY TIME CHANGE. PT AWARE THAT SURGERY WILL NOW START AT 0730 AND WILL NEED TO ARRIVE TO 3RD FLOOR SHORT STAY AT 0530

## 2017-07-13 NOTE — Anesthesia Preprocedure Evaluation (Addendum)
Anesthesia Evaluation  Patient identified by MRN, date of birth, ID band Patient awake    Reviewed: Allergy & Precautions, NPO status , Patient's Chart, lab work & pertinent test results  History of Anesthesia Complications Negative for: history of anesthetic complications  Airway Mallampati: II  TM Distance: >3 FB Neck ROM: Full    Dental  (+) Dental Advisory Given   Pulmonary sleep apnea and Continuous Positive Airway Pressure Ventilation , former smoker,    breath sounds clear to auscultation       Cardiovascular hypertension, Pt. on medications (-) angina Rhythm:Regular Rate:Normal     Neuro/Psych Chronic back pain    GI/Hepatic negative GI ROS, Neg liver ROS,   Endo/Other  Morbid obesity  Renal/GU negative Renal ROS     Musculoskeletal   Abdominal (+) + obese,   Peds  Hematology negative hematology ROS (+)   Anesthesia Other Findings   Reproductive/Obstetrics                            Anesthesia Physical Anesthesia Plan  ASA: III  Anesthesia Plan: General   Post-op Pain Management:    Induction: Intravenous  PONV Risk Score and Plan: 3 and Ondansetron, Dexamethasone and Midazolam  Airway Management Planned: Oral ETT  Additional Equipment:   Intra-op Plan:   Post-operative Plan: Extubation in OR  Informed Consent: I have reviewed the patients History and Physical, chart, labs and discussed the procedure including the risks, benefits and alternatives for the proposed anesthesia with the patient or authorized representative who has indicated his/her understanding and acceptance.   Dental advisory given  Plan Discussed with: CRNA and Surgeon  Anesthesia Plan Comments: (Plan routine monitors, GETA)        Anesthesia Quick Evaluation

## 2017-07-14 ENCOUNTER — Ambulatory Visit (HOSPITAL_COMMUNITY): Payer: Non-veteran care

## 2017-07-14 ENCOUNTER — Ambulatory Visit (HOSPITAL_COMMUNITY)
Admission: RE | Admit: 2017-07-14 | Discharge: 2017-07-16 | Disposition: A | Discharge status: Home / Self Care | Payer: Non-veteran care | Source: Ambulatory Visit | Attending: Specialist | Admitting: Specialist

## 2017-07-14 ENCOUNTER — Encounter (HOSPITAL_COMMUNITY): Payer: Self-pay | Admitting: *Deleted

## 2017-07-14 ENCOUNTER — Ambulatory Visit (HOSPITAL_COMMUNITY): Payer: Non-veteran care | Admitting: Anesthesiology

## 2017-07-14 ENCOUNTER — Encounter (HOSPITAL_COMMUNITY)
Admission: RE | Disposition: A | Discharge status: Home / Self Care | Payer: Self-pay | Source: Ambulatory Visit | Attending: Specialist

## 2017-07-14 DIAGNOSIS — I700 Atherosclerosis of aorta: Secondary | ICD-10-CM | POA: Diagnosis not present

## 2017-07-14 DIAGNOSIS — M4807 Spinal stenosis, lumbosacral region: Secondary | ICD-10-CM | POA: Diagnosis not present

## 2017-07-14 DIAGNOSIS — G4730 Sleep apnea, unspecified: Secondary | ICD-10-CM | POA: Diagnosis not present

## 2017-07-14 DIAGNOSIS — Z87891 Personal history of nicotine dependence: Secondary | ICD-10-CM | POA: Diagnosis not present

## 2017-07-14 DIAGNOSIS — I10 Essential (primary) hypertension: Secondary | ICD-10-CM | POA: Diagnosis not present

## 2017-07-14 DIAGNOSIS — M961 Postlaminectomy syndrome, not elsewhere classified: Secondary | ICD-10-CM | POA: Diagnosis present

## 2017-07-14 DIAGNOSIS — E882 Lipomatosis, not elsewhere classified: Secondary | ICD-10-CM | POA: Diagnosis not present

## 2017-07-14 DIAGNOSIS — M48062 Spinal stenosis, lumbar region with neurogenic claudication: Secondary | ICD-10-CM | POA: Diagnosis present

## 2017-07-14 DIAGNOSIS — M5136 Other intervertebral disc degeneration, lumbar region: Secondary | ICD-10-CM | POA: Diagnosis not present

## 2017-07-14 DIAGNOSIS — Q766 Other congenital malformations of ribs: Secondary | ICD-10-CM | POA: Insufficient documentation

## 2017-07-14 DIAGNOSIS — M48061 Spinal stenosis, lumbar region without neurogenic claudication: Secondary | ICD-10-CM | POA: Diagnosis present

## 2017-07-14 DIAGNOSIS — Z419 Encounter for procedure for purposes other than remedying health state, unspecified: Secondary | ICD-10-CM

## 2017-07-14 DIAGNOSIS — Z79899 Other long term (current) drug therapy: Secondary | ICD-10-CM | POA: Diagnosis not present

## 2017-07-14 HISTORY — PX: LUMBAR LAMINECTOMY/DECOMPRESSION MICRODISCECTOMY: SHX5026

## 2017-07-14 SURGERY — LUMBAR LAMINECTOMY/DECOMPRESSION MICRODISCECTOMY
Anesthesia: General | Site: Back

## 2017-07-14 MED ORDER — PROMETHAZINE HCL 25 MG/ML IJ SOLN: 6.2500 mg | INTRAMUSCULAR | Status: DC | PRN

## 2017-07-14 MED ORDER — HYDROMORPHONE HCL-NACL 0.5-0.9 MG/ML-% IV SOSY
PREFILLED_SYRINGE | INTRAVENOUS | Status: AC
  Filled 2017-07-14: qty 2

## 2017-07-14 MED ORDER — SUGAMMADEX SODIUM 200 MG/2ML IV SOLN
INTRAVENOUS | Status: DC | PRN
  Administered 2017-07-14: 200 mg via INTRAVENOUS

## 2017-07-14 MED ORDER — HYDROMORPHONE HCL-NACL 0.5-0.9 MG/ML-% IV SOSY
PREFILLED_SYRINGE | INTRAVENOUS | Status: AC
Start: 2017-07-14 — End: 2017-07-14
  Filled 2017-07-14: qty 2

## 2017-07-14 MED ORDER — LACTATED RINGERS IV SOLN
INTRAVENOUS | Status: DC
  Administered 2017-07-14 (×2): via INTRAVENOUS

## 2017-07-14 MED ORDER — THROMBIN 5000 UNITS EX SOLR
CUTANEOUS | Status: AC
  Filled 2017-07-14: qty 10000

## 2017-07-14 MED ORDER — KCL IN DEXTROSE-NACL 20-5-0.45 MEQ/L-%-% IV SOLN
INTRAVENOUS | Status: AC
  Administered 2017-07-14: 14:00:00 via INTRAVENOUS
  Filled 2017-07-14 (×3): qty 1000

## 2017-07-14 MED ORDER — MEPERIDINE HCL 50 MG/ML IJ SOLN: 6.2500 mg | INTRAMUSCULAR | Status: DC | PRN

## 2017-07-14 MED ORDER — ONDANSETRON HCL 4 MG/2ML IJ SOLN
INTRAMUSCULAR | Status: DC | PRN
  Administered 2017-07-14: 4 mg via INTRAVENOUS

## 2017-07-14 MED ORDER — MIDAZOLAM HCL 2 MG/2ML IJ SOLN
INTRAMUSCULAR | Status: AC
  Filled 2017-07-14: qty 2

## 2017-07-14 MED ORDER — ACETAMINOPHEN 650 MG RE SUPP: 650.0000 mg | RECTAL | Status: DC | PRN

## 2017-07-14 MED ORDER — SODIUM CHLORIDE 0.9 % IV SOLN
INTRAVENOUS | Status: DC | PRN
Start: 2017-07-14 — End: 2017-07-14
  Administered 2017-07-14: 500 mL

## 2017-07-14 MED ORDER — PROPOFOL 10 MG/ML IV BOLUS
INTRAVENOUS | Status: AC
  Filled 2017-07-14: qty 20

## 2017-07-14 MED ORDER — POLYETHYLENE GLYCOL 3350 17 G PO PACK: 17.0000 g | PACK | Freq: Every day | ORAL | 0 refills | Status: DC

## 2017-07-14 MED ORDER — LIDOCAINE 2% (20 MG/ML) 5 ML SYRINGE
INTRAMUSCULAR | Status: AC
  Filled 2017-07-14: qty 5

## 2017-07-14 MED ORDER — BUPIVACAINE-EPINEPHRINE (PF) 0.5% -1:200000 IJ SOLN
INTRAMUSCULAR | Status: AC
  Filled 2017-07-14: qty 30

## 2017-07-14 MED ORDER — MIDAZOLAM HCL 5 MG/5ML IJ SOLN
INTRAMUSCULAR | Status: DC | PRN
  Administered 2017-07-14: 2 mg via INTRAVENOUS

## 2017-07-14 MED ORDER — ACETAMINOPHEN 10 MG/ML IV SOLN
INTRAVENOUS | Status: AC
  Filled 2017-07-14: qty 100

## 2017-07-14 MED ORDER — HYDROMORPHONE HCL-NACL 0.5-0.9 MG/ML-% IV SOSY
0.2500 mg | PREFILLED_SYRINGE | INTRAVENOUS | Status: DC | PRN
  Administered 2017-07-14 (×4): 0.5 mg via INTRAVENOUS

## 2017-07-14 MED ORDER — CEFAZOLIN SODIUM-DEXTROSE 2-4 GM/100ML-% IV SOLN
INTRAVENOUS | Status: AC
  Filled 2017-07-14: qty 100

## 2017-07-14 MED ORDER — CHLORTHALIDONE 25 MG PO TABS
25.0000 mg | ORAL_TABLET | Freq: Every day | ORAL | Status: DC
  Administered 2017-07-14 – 2017-07-16 (×3): 25 mg via ORAL
  Filled 2017-07-14 (×3): qty 1

## 2017-07-14 MED ORDER — ONDANSETRON HCL 4 MG/2ML IJ SOLN
4.0000 mg | Freq: Four times a day (QID) | INTRAMUSCULAR | Status: DC | PRN
  Administered 2017-07-15: 4 mg via INTRAVENOUS
  Filled 2017-07-14 (×2): qty 2

## 2017-07-14 MED ORDER — DEXAMETHASONE SODIUM PHOSPHATE 10 MG/ML IJ SOLN
INTRAMUSCULAR | Status: AC
  Filled 2017-07-14: qty 1

## 2017-07-14 MED ORDER — METHOCARBAMOL 1000 MG/10ML IJ SOLN
500.0000 mg | Freq: Four times a day (QID) | INTRAVENOUS | Status: DC | PRN
  Administered 2017-07-14: 500 mg via INTRAVENOUS
  Filled 2017-07-14: qty 550

## 2017-07-14 MED ORDER — POLYETHYLENE GLYCOL 3350 17 G PO PACK: 17.0000 g | PACK | Freq: Every day | ORAL | Status: DC | PRN

## 2017-07-14 MED ORDER — THROMBIN 5000 UNITS EX SOLR
OROMUCOSAL | Status: DC | PRN
  Administered 2017-07-14: 5 mL via TOPICAL

## 2017-07-14 MED ORDER — ACETAMINOPHEN 325 MG PO TABS: 650.0000 mg | ORAL_TABLET | ORAL | Status: DC | PRN

## 2017-07-14 MED ORDER — ACETAMINOPHEN 10 MG/ML IV SOLN
1000.0000 mg | INTRAVENOUS | Status: AC
Start: 2017-07-14 — End: 2017-07-14
  Administered 2017-07-14: 1000 mg via INTRAVENOUS

## 2017-07-14 MED ORDER — CEFAZOLIN SODIUM-DEXTROSE 2-4 GM/100ML-% IV SOLN
2.0000 g | Freq: Three times a day (TID) | INTRAVENOUS | Status: AC
  Administered 2017-07-14 – 2017-07-15 (×3): 2 g via INTRAVENOUS
  Filled 2017-07-14 (×3): qty 100

## 2017-07-14 MED ORDER — MAGNESIUM CITRATE PO SOLN: 1.0000 | Freq: Once | ORAL | Status: DC | PRN

## 2017-07-14 MED ORDER — FENTANYL CITRATE (PF) 250 MCG/5ML IJ SOLN
INTRAMUSCULAR | Status: AC
  Filled 2017-07-14: qty 5

## 2017-07-14 MED ORDER — ROCURONIUM BROMIDE 10 MG/ML (PF) SYRINGE
PREFILLED_SYRINGE | INTRAVENOUS | Status: DC | PRN
  Administered 2017-07-14: 10 mg via INTRAVENOUS
  Administered 2017-07-14: 50 mg via INTRAVENOUS
  Administered 2017-07-14: 10 mg via INTRAVENOUS

## 2017-07-14 MED ORDER — MENTHOL 3 MG MT LOZG: 1.0000 | LOZENGE | OROMUCOSAL | Status: DC | PRN

## 2017-07-14 MED ORDER — METHOCARBAMOL 500 MG PO TABS
500.0000 mg | ORAL_TABLET | Freq: Four times a day (QID) | ORAL | Status: DC | PRN
  Administered 2017-07-14 – 2017-07-15 (×4): 500 mg via ORAL
  Filled 2017-07-14 (×4): qty 1

## 2017-07-14 MED ORDER — ONDANSETRON HCL 4 MG/2ML IJ SOLN
INTRAMUSCULAR | Status: AC
  Filled 2017-07-14: qty 2

## 2017-07-14 MED ORDER — BISACODYL 5 MG PO TBEC: 5.0000 mg | DELAYED_RELEASE_TABLET | Freq: Every day | ORAL | Status: DC | PRN

## 2017-07-14 MED ORDER — CEFAZOLIN SODIUM-DEXTROSE 2-4 GM/100ML-% IV SOLN
2.0000 g | INTRAVENOUS | Status: AC
  Administered 2017-07-14: 2 g via INTRAVENOUS

## 2017-07-14 MED ORDER — SUGAMMADEX SODIUM 200 MG/2ML IV SOLN
INTRAVENOUS | Status: AC
  Filled 2017-07-14: qty 2

## 2017-07-14 MED ORDER — ONDANSETRON HCL 4 MG PO TABS
4.0000 mg | ORAL_TABLET | Freq: Four times a day (QID) | ORAL | Status: DC | PRN
Start: 2017-07-14 — End: 2017-07-16

## 2017-07-14 MED ORDER — LIDOCAINE 2% (20 MG/ML) 5 ML SYRINGE
INTRAMUSCULAR | Status: DC | PRN
  Administered 2017-07-14: 100 mg via INTRAVENOUS

## 2017-07-14 MED ORDER — DOCUSATE SODIUM 100 MG PO CAPS: 100.0000 mg | ORAL_CAPSULE | Freq: Two times a day (BID) | ORAL | 1 refills | Status: DC | PRN

## 2017-07-14 MED ORDER — BUPIVACAINE-EPINEPHRINE (PF) 0.5% -1:200000 IJ SOLN
INTRAMUSCULAR | Status: DC | PRN
  Administered 2017-07-14: 15 mL

## 2017-07-14 MED ORDER — FENTANYL CITRATE (PF) 100 MCG/2ML IJ SOLN
INTRAMUSCULAR | Status: DC | PRN
  Administered 2017-07-14 (×3): 50 ug via INTRAVENOUS

## 2017-07-14 MED ORDER — OXYCODONE HCL 5 MG PO TABS
5.0000 mg | ORAL_TABLET | ORAL | Status: DC | PRN
  Administered 2017-07-14 – 2017-07-16 (×12): 10 mg via ORAL
  Filled 2017-07-14 (×12): qty 2

## 2017-07-14 MED ORDER — DEXAMETHASONE SODIUM PHOSPHATE 10 MG/ML IJ SOLN
INTRAMUSCULAR | Status: DC | PRN
  Administered 2017-07-14: 10 mg via INTRAVENOUS

## 2017-07-14 MED ORDER — OXYCODONE-ACETAMINOPHEN 5-325 MG PO TABS: 1.0000 | ORAL_TABLET | ORAL | 0 refills | Status: DC | PRN

## 2017-07-14 MED ORDER — ZOLPIDEM TARTRATE 10 MG PO TABS
10.0000 mg | ORAL_TABLET | Freq: Every evening | ORAL | Status: DC | PRN
  Administered 2017-07-14 – 2017-07-15 (×2): 10 mg via ORAL
  Filled 2017-07-14 (×2): qty 1

## 2017-07-14 MED ORDER — SODIUM CHLORIDE 0.9 % IV SOLN
INTRAVENOUS | Status: AC
  Filled 2017-07-14: qty 500000

## 2017-07-14 MED ORDER — DOCUSATE SODIUM 100 MG PO CAPS
100.0000 mg | ORAL_CAPSULE | Freq: Two times a day (BID) | ORAL | Status: DC
  Administered 2017-07-14 – 2017-07-16 (×4): 100 mg via ORAL
  Filled 2017-07-14 (×4): qty 1

## 2017-07-14 MED ORDER — PHENYLEPHRINE 40 MCG/ML (10ML) SYRINGE FOR IV PUSH (FOR BLOOD PRESSURE SUPPORT)
PREFILLED_SYRINGE | INTRAVENOUS | Status: DC | PRN
  Administered 2017-07-14: 80 ug via INTRAVENOUS

## 2017-07-14 MED ORDER — RISAQUAD PO CAPS
1.0000 | ORAL_CAPSULE | Freq: Every day | ORAL | Status: DC
  Administered 2017-07-14 – 2017-07-16 (×3): 1 via ORAL
  Filled 2017-07-14 (×3): qty 1

## 2017-07-14 MED ORDER — METHOCARBAMOL 500 MG PO TABS: 500.0000 mg | ORAL_TABLET | Freq: Four times a day (QID) | ORAL | 1 refills | Status: DC | PRN

## 2017-07-14 MED ORDER — MIDAZOLAM HCL 2 MG/2ML IJ SOLN: 0.5000 mg | Freq: Once | INTRAMUSCULAR | Status: DC | PRN

## 2017-07-14 MED ORDER — HYDROMORPHONE HCL-NACL 0.5-0.9 MG/ML-% IV SOSY
1.0000 mg | PREFILLED_SYRINGE | INTRAVENOUS | Status: DC | PRN
  Administered 2017-07-14 – 2017-07-15 (×4): 1 mg via INTRAVENOUS
  Filled 2017-07-14 (×4): qty 2

## 2017-07-14 MED ORDER — ROCURONIUM BROMIDE 50 MG/5ML IV SOSY
PREFILLED_SYRINGE | INTRAVENOUS | Status: AC
  Filled 2017-07-14: qty 5

## 2017-07-14 MED ORDER — PROPOFOL 10 MG/ML IV BOLUS
INTRAVENOUS | Status: DC | PRN
Start: 2017-07-14 — End: 2017-07-14
  Administered 2017-07-14: 200 mg via INTRAVENOUS

## 2017-07-14 MED ORDER — PHENOL 1.4 % MT LIQD: 1.0000 | OROMUCOSAL | Status: DC | PRN

## 2017-07-14 MED ORDER — ALUM & MAG HYDROXIDE-SIMETH 200-200-20 MG/5ML PO SUSP: 30.0000 mL | Freq: Four times a day (QID) | ORAL | Status: DC | PRN

## 2017-07-14 MED ORDER — THROMBIN 5000 UNITS EX SOLR
CUTANEOUS | Status: DC | PRN
  Administered 2017-07-14: 5 mL via TOPICAL

## 2017-07-14 SURGICAL SUPPLY — 48 items
BAG ZIPLOCK 12X15 (MISCELLANEOUS) ×3 IMPLANT
CLOSURE WOUND 1/2 X4 (GAUZE/BANDAGES/DRESSINGS) ×1
CLOTH 2% CHLOROHEXIDINE 3PK (PERSONAL CARE ITEMS) ×3 IMPLANT
COVER SURGICAL LIGHT HANDLE (MISCELLANEOUS) ×3 IMPLANT
DRAPE MICROSCOPE LEICA (MISCELLANEOUS) ×3 IMPLANT
DRAPE SHEET LG 3/4 BI-LAMINATE (DRAPES) ×3 IMPLANT
DRAPE SURG 17X11 SM STRL (DRAPES) ×3 IMPLANT
DRAPE UTILITY XL STRL (DRAPES) ×3 IMPLANT
DRESSING AQUACEL AG SP 3.5X6 (GAUZE/BANDAGES/DRESSINGS) ×1 IMPLANT
DRSG AQUACEL AG ADV 3.5X 4 (GAUZE/BANDAGES/DRESSINGS) ×3 IMPLANT
DRSG AQUACEL AG ADV 3.5X 6 (GAUZE/BANDAGES/DRESSINGS) ×3 IMPLANT
DRSG AQUACEL AG SP 3.5X6 (GAUZE/BANDAGES/DRESSINGS) ×3
DURAPREP 26ML APPLICATOR (WOUND CARE) ×3 IMPLANT
ELECT BLADE TIP CTD 4 INCH (ELECTRODE) ×3 IMPLANT
ELECT REM PT RETURN 15FT ADLT (MISCELLANEOUS) ×3 IMPLANT
GLOVE BIOGEL PI IND STRL 7.0 (GLOVE) ×1 IMPLANT
GLOVE BIOGEL PI IND STRL 7.5 (GLOVE) ×3 IMPLANT
GLOVE BIOGEL PI INDICATOR 7.0 (GLOVE) ×2
GLOVE BIOGEL PI INDICATOR 7.5 (GLOVE) ×6
GLOVE SURG SS PI 7.0 STRL IVOR (GLOVE) ×3 IMPLANT
GLOVE SURG SS PI 7.5 STRL IVOR (GLOVE) ×3 IMPLANT
GLOVE SURG SS PI 8.0 STRL IVOR (GLOVE) ×6 IMPLANT
GOWN STRL REUS W/TWL XL LVL3 (GOWN DISPOSABLE) ×9 IMPLANT
IV CATH 14GX2 1/4 (CATHETERS) ×3 IMPLANT
KIT BASIN OR (CUSTOM PROCEDURE TRAY) ×3 IMPLANT
KIT POSITIONING SURG ANDREWS (MISCELLANEOUS) ×3 IMPLANT
MANIFOLD NEPTUNE II (INSTRUMENTS) ×3 IMPLANT
NEEDLE SPNL 18GX3.5 QUINCKE PK (NEEDLE) ×9 IMPLANT
PACK LAMINECTOMY ORTHO (CUSTOM PROCEDURE TRAY) ×3 IMPLANT
PATTIES SURGICAL .5 X.5 (GAUZE/BANDAGES/DRESSINGS) IMPLANT
PATTIES SURGICAL .75X.75 (GAUZE/BANDAGES/DRESSINGS) ×3 IMPLANT
RUBBERBAND STERILE (MISCELLANEOUS) ×6 IMPLANT
SPONGE LAP 4X18 X RAY DECT (DISPOSABLE) ×3 IMPLANT
SPONGE SURGIFOAM ABS GEL 100 (HEMOSTASIS) ×3 IMPLANT
STAPLER VISISTAT (STAPLE) ×3 IMPLANT
STRIP CLOSURE SKIN 1/2X4 (GAUZE/BANDAGES/DRESSINGS) ×2 IMPLANT
SUT NURALON 4 0 TR CR/8 (SUTURE) IMPLANT
SUT PROLENE 3 0 PS 2 (SUTURE) IMPLANT
SUT VIC AB 1 CT1 27 (SUTURE) ×4
SUT VIC AB 1 CT1 27XBRD ANTBC (SUTURE) ×2 IMPLANT
SUT VIC AB 1-0 CT2 27 (SUTURE) IMPLANT
SUT VIC AB 2-0 CT1 27 (SUTURE) ×2
SUT VIC AB 2-0 CT1 TAPERPNT 27 (SUTURE) ×1 IMPLANT
SUT VIC AB 2-0 CT2 27 (SUTURE) ×3 IMPLANT
SYR 3ML LL SCALE MARK (SYRINGE) ×3 IMPLANT
TOWEL OR 17X26 10 PK STRL BLUE (TOWEL DISPOSABLE) ×3 IMPLANT
TRAY FOLEY CATH 16FR SILVER (SET/KITS/TRAYS/PACK) ×3 IMPLANT
YANKAUER SUCT BULB TIP NO VENT (SUCTIONS) ×3 IMPLANT

## 2017-07-14 NOTE — Anesthesia Postprocedure Evaluation (Signed)
Anesthesia Post Note  Patient: [name]  Procedure(s) Performed: Procedure(s) (LRB): CENTRAL MICROLUMBAR DECOMPRESSION OF L3-L4 AND L4-L5,  AND EXCISION OF LIPOMA (N/A)     Patient location during evaluation: PACU Anesthesia Type: General Level of consciousness: awake and alert, patient cooperative and oriented Pain management: pain level controlled Vital Signs Assessment: post-procedure vital signs reviewed and stable Respiratory status: spontaneous breathing, nonlabored ventilation and respiratory function stable Cardiovascular status: blood pressure returned to baseline and stable Postop Assessment: no signs of nausea or vomiting Anesthetic complications: no    Last Vitals:  Vitals:   07/14/17 1109 07/14/17 1239  BP: 134/76 112/67  Pulse: 75 66  Resp: 13 15  Temp: 36.4 C 36.4 C  SpO2: 98% 96%    Last Pain:  Vitals:   07/14/17 1239  TempSrc: Oral  PainSc:                  Alyshia Kernan,E. Franshesca Chipman

## 2017-07-14 NOTE — Discharge Instructions (Signed)
Walk As Tolerated utilizing back precautions.  No bending, twisting, or lifting.  No driving for 2 weeks.   °Aquacel dressing may remain in place until follow up. May shower with aquacel dressing in place. If the dressing peels off or becomes saturated, you may remove aquacel dressing and place gauze and tape dressing which should be kept clean and dry and changed daily. Do not remove steri-strips if they are present. °See Dr. Tyree Fluharty in office in 10 to 14 days. Begin taking aspirin 81mg per day starting 4 days after your surgery if not allergic to aspirin or on another blood thinner. °Walk daily even outside. Use a cane or walker only if necessary. °Avoid sitting on soft sofas. ° °

## 2017-07-14 NOTE — Interval H&P Note (Signed)
History and Physical Interval Note:  07/14/2017 7:26 AM  [name]  has presented today for surgery, with the diagnosis of Spinal Stenosis   The various methods of treatment have been discussed with the patient and family. After consideration of risks, benefits and other options for treatment, the patient has consented to  Procedure(s): Microlumbar decompression L4-5, possible L3-4 and L5-S1 (N/A) as a surgical intervention .  The patient's history has been reviewed, patient examined, no change in status, stable for surgery.  I have reviewed the patient's chart and labs.  Questions were answered to the patient's satisfaction.     [name] C

## 2017-07-14 NOTE — Anesthesia Procedure Notes (Signed)
Procedure Name: Intubation Date/Time: 07/14/2017 7:39 AM Performed by: Carleene Cooper A Pre-anesthesia Checklist: Patient identified, Emergency Drugs available, Suction available, Patient being monitored and Timeout performed Patient Re-evaluated:Patient Re-evaluated prior to induction Oxygen Delivery Method: Circle system utilized Preoxygenation: Pre-oxygenation with 100% oxygen Induction Type: IV induction Ventilation: Mask ventilation without difficulty Laryngoscope Size: Mac and 4 Grade View: Grade I Tube type: Oral Tube size: 7.5 mm Number of attempts: 1 Airway Equipment and Method: Stylet Placement Confirmation: ETT inserted through vocal cords under direct vision,  positive ETCO2 and breath sounds checked- equal and bilateral Secured at: 22 cm Tube secured with: Tape Dental Injury: Teeth and Oropharynx as per pre-operative assessment

## 2017-07-14 NOTE — Progress Notes (Signed)
Pt states that he will self administer CPAP when ready for bed.  Pt to notify RT if any assistance is required throughout the night.  RT to monitor and assess as needed.  

## 2017-07-14 NOTE — Brief Op Note (Signed)
07/14/2017  9:21 AM  PATIENT:  [name]  51 y.o. male  PRE-OPERATIVE DIAGNOSIS:  Spinal Stenosis   POST-OPERATIVE DIAGNOSIS:  Spinal Stenosis   PROCEDURE:  Procedure(s): CENTRAL MICROLUMBAR DECOMPRESSION OF L3-L4 AND L4-L5,  AND EXCISION OF LIPOMA (N/A)  SURGEON:  Surgeon(s) and Role:    Susa Day, MD - Primary  PHYSICIAN ASSISTANT:   ASSISTANTS: [place]   ANESTHESIA:   general  EBL:  Total I/O In: -  Out: 100 [Blood:100]  BLOOD ADMINISTERED:none  DRAINS: none   LOCAL MEDICATIONS USED:  MARCAINE     SPECIMEN:  No Specimen  DISPOSITION OF SPECIMEN:  N/A  COUNTS:  YES  TOURNIQUET:  * No tourniquets in log *  DICTATION: .Other Dictation: Dictation Number  [id number]  PLAN OF CARE: Admit for overnight observation  PATIENT DISPOSITION:  PACU - hemodynamically stable.   Delay start of Pharmacological VTE agent (>24hrs) due to surgical blood loss or risk of bleeding: yes

## 2017-07-14 NOTE — Transfer of Care (Signed)
Immediate Anesthesia Transfer of Care Note  Patient: [name]  Procedure(s) Performed: Procedure(s): CENTRAL MICROLUMBAR DECOMPRESSION OF L3-L4 AND L4-L5,  AND EXCISION OF LIPOMA (N/A)  Patient Location: PACU  Anesthesia Type:General  Level of Consciousness: awake, alert , oriented and patient cooperative  Airway & Oxygen Therapy: Patient Spontanous Breathing and Patient connected to face mask oxygen  Post-op Assessment: Report given to RN, Post -op Vital signs reviewed and stable and Patient moving all extremities  Post vital signs: Reviewed and stable  Last Vitals:  Vitals:   07/14/17 0640  BP: (!) 130/98  Pulse: 88  Resp: 18  Temp: 36.7 C  SpO2: 99%    Last Pain:  Vitals:   07/14/17 0640  TempSrc: Oral  PainSc:       Patients Stated Pain Goal: 4 (41/42/39 [id number])  Complications: No apparent anesthesia complications

## 2017-07-14 NOTE — H&P (View-Only) (Signed)
[name] is an 51 y.o. male.   Chief Complaint: back and leg pain HPI: The patient is a 51 year old male who presents today for follow up of their back. The patient is being followed for their central back pain (pain radiates down both legs, further down the left leg). They are now year(s) out from injury. Symptoms reported today include: pain. The patient states that they are doing poorly. The following medication has been used for pain control: none. The patient reports their current pain level to be severe (varies from mild to severe, usually averages around a 7). The patient presents today following myelogram. Note for "Follow-up back": Had alot of discomfort in low back and bilat knees after the Myelogram.  [name] reports bilateral leg pain, multi-year duration. He has to sit for relief. He has limited ambulatory capacity, less than a block, both legs. Has to sit for relief. He has a myelogram and is here for review. He has minimal back pain, mainly buttock and leg pain.  No change in bowel or bladder function.  No past medical history on file.  Past Surgical History:  Procedure Laterality Date  . BILATERAL KNEE ARTHROSCOPY    . c5-6     removal of fusion and spacers  . CERVICAL SPINE SURGERY     2015,OCTOBER    Family History  Problem Relation Age of Onset  . Hepatitis Mother   . Colon cancer Neg Hx    Social History:  reports that he has quit smoking. His smoking use included Cigarettes. He has never used smokeless tobacco. He reports that he drinks about 8.4 oz of alcohol per week . He reports that he does not use drugs.  Allergies:  Allergies  Allergen Reactions  . Lisinopril Swelling and Rash  . Tramadol Other (See Comments)    "complete blackout;" can't remember a thing     (Not in a hospital admission)  No results found for this or any previous visit (from the past 48 hour(s)). No results found.  Review of Systems  Constitutional:  Negative.   HENT: Negative.   Eyes: Negative.   Respiratory: Negative.   Cardiovascular: Negative.   Gastrointestinal: Negative.   Genitourinary: Negative.   Musculoskeletal: Positive for back pain.  Skin: Negative.   Neurological: Positive for sensory change and focal weakness.  Psychiatric/Behavioral: Negative.     There were no vitals taken for this visit. Physical Exam  Constitutional: He is oriented to person, place, and time. He appears well-developed. He appears distressed.  HENT:  Head: Normocephalic.  Eyes: Pupils are equal, round, and reactive to light.  Neck: Normal range of motion.  Cardiovascular: Normal rate.   Respiratory: Effort normal.  GI: Soft.  Musculoskeletal:  On exam, moderate distress. He walks with an antalgic forward-flexed gait with a cane. He has bilateral knee braces on. Straight leg raise with buttock, thigh and calf pain on the left, buttock pain on the right. He has 4+/5 EHL on the left, 5-/5 on the right. Slight quad weakness left compared to the right. Diminished Achilles reflex on the left compared to the right. Dysesthesia in the plantar aspect of the foot. Diminished repetitive plantar flexion.  Lumbar spine exam reveals no evidence of soft tissue swelling, deformity or skin ecchymosis. On palpation there is no tenderness of the lumbar spine. No flank pain with percussion. The abdomen is soft and nontender. Nontender over the trochanters. No cellulitis or lymphadenopathy.  Good range of motion of the  lumbar spine without associated pain. Straight leg raise is negative. Motor is 5/5 including EHL, tibialis anterior, plantar flexion, quadriceps and hamstrings. Patient is normoreflexic. There is no Babinski or clonus. Sensory exam is intact to light touch. Patient has good distal pulses. No DVT. No pain and normal range of motion without instability of the hips, knees and ankles.  Cervical spine, he has some discomfort with end-forward flexion. He is  status post two-level cervical fusion.  Neurological: He is alert and oriented to person, place, and time.    Myelogram was reviewed and it shows severe spinal stenosis at L4-L5. Into L5-S1, he has congenitally short pedicles. He has listhesis at L5 due to the facets but no pars defect. No instability in flexion and extension.  Again, his MRI demonstrates severe stenosis at L4-L5 extending up above near to the pedicle of L4 and below L5.  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 It is offset at L5-S1 but no instability due to the facet arthropathy. He has a tall disc. He is not unstable.  We discussed options; living with the symptoms in forward flexion, Williams program. Surgical options were discussed, I do feel with at least decompression at L4-L5, also at L3-L4 and L5-S1. We will remove the neural arch at L5 and L4. I did not feel that he needs concomitant stabilization, tall disc, not unstable. He has mild degeneration and facet arthropathy. We discussed this is not an operation for back pain but to give him leg pain relief and his claudication and that he may need a fusion in the future. We discussed this in extensive detail. I had an extensive discussion of the risks and benefits of the lumbar decompression with the patient including bleeding, infection, damage to neurovascular structures, epidural fibrosis, CSF leak requiring repair. We also discussed increase in pain, adjacent segment disease, recurrent disc herniation, need for future surgery including repeat decompression and/or fusion. We also discussed risks of postoperative hematoma, paralysis, anesthetic complications including DVT, PE, death, cardiopulmonary dysfunction. In addition, the perioperative and postoperative courses were discussed in detail including the rehabilitative time and  return to functional activity and work. I provided the patient with an illustrated handout and utilized the appropriate surgical models. No history of DVT, MRSA. He is otherwise healthy. Overnight in the hospital. Follow up in three to four weeks.  Plan microlumbar decompression L4-5, possible L3-4, possible L5-S1  Adolphe Fortunato, Conley Rolls., PA-C for Dr. Tonita Cong 06/17/2017, 11:02 AM

## 2017-07-14 NOTE — Op Note (Signed)
[name], [name] NO.:  [id number]  MEDICAL RECORD NO.:  [id number]  LOCATION:  [facility]                         FACILITY:  [facility]  PHYSICIAN:  Susa Day, M.D.    DATE OF BIRTH:  [birth year]  DATE OF PROCEDURE:  07/14/2017 DATE OF DISCHARGE:                              OPERATIVE REPORT   PREOPERATIVE DIAGNOSES: 1. Spinal stenosis at L3-4, L4-5, L5-S1. 2. Epidural lipomatosis.  POSTOPERATIVE DIAGNOSES: 1. Spinal stenosis at L3-4, L4-5, L5-S1. 2. Epidural lipomatosis.  PROCEDURE PERFORMED:  Microlumbar decompression at L3-4, L4-5 with bilateral hemilaminotomies and foraminotomies for 4 and 5. Excision of epidural lipomatosis from L4-5, L3-4 and L5-S1.  ANESTHESIA:  General.  ASSISTANT:  Lacie Draft PA.  HISTORY:  This is a 51 year old male with neurogenic claudication secondary to congenital spinal stenosis secondary to short pedicles as well as epidural lipomatosis, disk degeneration at L4-5, disk protrusion and bilateral neurogenic claudication with minimal ambulatory capacity before claudication symptoms required a seated position.  He had increased lumbosacral angle.  He had facet arthropathy in offset at L5- S1 without instability in flexion and extension, elongated pars. Myelogram, indicated an essentially block at L4-5 secondary to the epidural lipomatosis and stenosis in the morphology of his lumbosacral angle.  We discussed decompression of L4-5, possible L3-4 and L5-S1. Risks and benefits were discussed including bleeding, infection, damage to the neurovascular structures, no change in symptoms or symptoms of DVT, PE, anesthetic complications, etc. or need for fusion in the future.  DESCRIPTION OF PROCEDURE:  With the patient in supine position after induction of adequate general anesthesia, 2 g Kefzol, placed prone on the [place] frame.  All bony prominences were well padded.  Lumbar region was prepped and draped in usual sterile  fashion.  Three 18-gauge spinal needles were utilized to localize the L3-4, L4-5, L5-S1 interspace confirmed with x-ray.  Incision was made from above 4 to below 5.  Subcutaneous tissue was dissected.  Electrocautery was utilized to achieve hemostasis.  A 0.25% of Marcaine with epinephrine was infiltrated in perimuscular tissue.  We divided the dorsolumbar fascia and we elevated the paraspinous musculature at L3-4, L4-5 and L5- S1.  McCullough retractor was placed.  Confirmatory radiograph obtained. Operating microscope was draped and brought on the surgical field to remove the spinous processes of 4 and 5 with a very small interlaminar window at L4-5 as the predominant pathology was at L4-5.  I utilized 2 mm Kerrison to begin a laminotomy of 4 first centrally to detach the ligamentum flavum.  With the first detached of the ligamentum flavum cephalad, there was exuberant epidural adipose tissue that exuded. Then, detached the ligamentum flavum from the cephalad edge of 5.  We in the process of removing the ligamentum flavum from the interspace, which was hypertrophic and contributing to the spinal stenosis.  I continued cephalad to removal of the neural arch to remove the epidural lipomatosis.  This after detaching the ligamentum and continuing cephalad was very small laminar arch remaining and therefore, completed the laminectomy at L4 and removed lipomatosis with a Woodson retractor by retracting it from particularly central and lateral recess.  We did at L4-5 as well.  We then evaluated the lamina of L4  and performed hemilaminotomies and foraminotomies of L5 bilaterally with a generous foraminotomy.  There was facet hypertrophy with stenosis of the L5 foramen.  There was shingling of the L5 lamina that contemplated removal of the entire arch.  However, he did have a slight offset at L5-S1 and due to his increased lumbosacral angle, I did not want to stabilize this listhesis in the  future.  Therefore, after performing hemilaminotomies, we utilized a Woodson retractor beneath the thecal sac centrally, bilaterally to remove the epidural lipomatosis from the centrally and underneath the lamina and beyond that down to the L5-S1 space.  After that we were able to remove the majority of the compressing lipomatosis. Good restoration of thecal sac and no probe passed freely up the foramen of 4 and 5 and above the pedicle of 4.  Copious irrigation with antibiotic irrigation and checked for the disk.  There was small protrusion, but no herniation or neural compression.  Bipolar cautery was utilized to achieve hemostasis as was bone wax and thrombin-soaked Gelfoam.  We copiously irrigated the wound and no evidence of CSF leakage or active bleeding.  We closed the dorsolumbar fascia with #1 Vicryl interrupted figure-of-eight sutures, subcu with 2- 0 and skin with staples.  Wound was dressed sterilely.  Placed supine on the hospital bed, extubated without difficulty and transported to the recovery room in satisfactory condition.  The patient tolerated the procedure well.  No complications.  Assistant, Lacie Draft, PA was used throughout the case for patient positioning, gentle intermittent neural traction, suction and closure.  Blood loss was 100 mL.     Susa Day, M.D.     [name]  D:  07/14/2017  T:  07/14/2017  Job:  [id number]

## 2017-07-15 DIAGNOSIS — M48062 Spinal stenosis, lumbar region with neurogenic claudication: Secondary | ICD-10-CM | POA: Diagnosis not present

## 2017-07-15 NOTE — Evaluation (Signed)
Occupational Therapy Evaluation Patient Details Name: [name] MRN: [id number] DOB: Jan 07, 1966 Today's Date: 07/15/2017    History of Present Illness s/p L3-4, L4-5 decompression, and removal of epidural lipomatosis L3, L4, L5   Clinical Impression   This 51 year old man was admitted for the above sx. All education was completed. No further OT is needed at this time     Follow Up Recommendations  No OT follow up;Supervision/Assistance - 24 hour    Equipment Recommendations  None recommended by OT    Recommendations for Other Services       Precautions / Restrictions Precautions Precautions: Back;Fall Restrictions Weight Bearing Restrictions: No      Mobility Bed Mobility Overal bed mobility: Independent                Transfers Overall transfer level: Needs assistance Equipment used: Rolling walker (2 wheeled) Transfers: Sit to/from Stand Sit to Stand: Min guard         General transfer comment: for safety; cues for UE placement    Balance                                           ADL either performed or assessed with clinical judgement   ADL Overall ADL's : Needs assistance/impaired     Grooming: Supervision/safety;Standing   Upper Body Bathing: Set up;Sitting   Lower Body Bathing: Moderate assistance;Sit to/from stand   Upper Body Dressing : Set up;Sitting   Lower Body Dressing: Moderate assistance;Sit to/from stand   Toilet Transfer: Min guard;Ambulation;RW;Comfort height toilet   Toileting- Clothing Manipulation and Hygiene: Min guard;Sit to/from stand         General ADL Comments: Pt has used a clothes hanger to assist with reach for donning pants. He will have assistance as needed.  Reviewed precautions, ADL adapations and toilet aide:  he is interested in this.       Vision         Perception     Praxis      Pertinent Vitals/Pain       Hand Dominance     Extremity/Trunk Assessment  Upper Extremity Assessment Upper Extremity Assessment: Overall WFL for tasks assessed           Communication Communication Communication: No difficulties   Cognition Arousal/Alertness: Awake/alert Behavior During Therapy: WFL for tasks assessed/performed Overall Cognitive Status: Within Functional Limits for tasks assessed                                     General Comments       Exercises     Shoulder Instructions      Home Living Family/patient expects to be discharged to:: Private residence Living Arrangements: Spouse/significant other                     Bathroom Toilet: Handicapped height         Additional Comments: vanity next to commode. Pt will sponge bathe as tub is upstairs      Prior Functioning/Environment Level of Independence: Independent                 OT Problem List:        OT Treatment/Interventions:      OT Goals(Current goals can be found in the  care plan section) Acute Rehab OT Goals Patient Stated Goal: have a good recovery OT Goal Formulation: All assessment and education complete, DC therapy  OT Frequency:     Barriers to D/C:            Co-evaluation              AM-PAC PT "6 Clicks" Daily Activity     Outcome Measure Help from another person eating meals?: None Help from another person taking care of personal grooming?: A Little Help from another person toileting, which includes using toliet, bedpan, or urinal?: A Little Help from another person bathing (including washing, rinsing, drying)?: A Lot Help from another person to put on and taking off regular upper body clothing?: A Little Help from another person to put on and taking off regular lower body clothing?: A Lot 6 Click Score: 17   End of Session    Activity Tolerance: Patient tolerated treatment well Patient left: in chair;with call bell/phone within reach;with family/visitor present  OT Visit Diagnosis: Muscle weakness  (generalized) (M62.81)                Time: 1287-8676 OT Time Calculation (min): 31 min Charges:  OT General Charges $OT Visit: 1 Procedure OT Evaluation $OT Eval Low Complexity: 1 Procedure OT Treatments $Self Care/Home Management : 8-22 mins G-Codes: OT G-codes **NOT FOR INPATIENT CLASS** Functional Assessment Tool Used: AM-PAC 6 Clicks Daily Activity Functional Limitation: Self care Self Care Current Status (H2094): At least 40 percent but less than 60 percent impaired, limited or restricted Self Care Goal Status (B0962): At least 40 percent but less than 60 percent impaired, limited or restricted Self Care Discharge Status [phone]): At least 40 percent but less than 60 percent impaired, limited or restricted   [name], OTR/L [phone] 07/15/2017  [name] 07/15/2017, 11:57 AM

## 2017-07-15 NOTE — Progress Notes (Signed)
Subjective: 1 Day Post-Op Procedure(s) (LRB): CENTRAL MICROLUMBAR DECOMPRESSION OF L3-L4 AND L4-L5,  AND EXCISION OF LIPOMA (N/A) Patient reports pain as 3 on 0-10 scale. Min leg pain    Objective: Vital signs in last 24 hours: Temp:  [97.4 F (36.3 C)-98.3 F (36.8 C)] 98.1 F (36.7 C) (08/23 0630) Pulse Rate:  [61-97] 94 (08/23 0630) Resp:  [10-20] 20 (08/23 0630) BP: (112-143)/(53-87) 117/64 (08/23 0630) SpO2:  [96 %-100 %] 99 % (08/23 0630)  Intake/Output from previous day: 08/22 0701 - 08/23 0700 In: 2370 [P.O.:120; I.V.:1900; IV Piggyback:350] Out: 2230 [Urine:2130; Blood:100] Intake/Output this shift: No intake/output data recorded.  No results for input(s): HGB in the last 72 hours. No results for input(s): WBC, RBC, HCT, PLT in the last 72 hours. No results for input(s): NA, K, CL, CO2, BUN, CREATININE, GLUCOSE, CALCIUM in the last 72 hours. No results for input(s): LABPT, INR in the last 72 hours.  Neurologically intact Neurovascular intact Sensation intact distally Intact pulses distally Dorsiflexion/Plantar flexion intact Incision: dressing C/D/I No DVT   Assessment/Plan: 1 Day Post-Op Procedure(s) (LRB): CENTRAL MICROLUMBAR DECOMPRESSION OF L3-L4 AND L4-L5,  AND EXCISION OF LIPOMA (N/A) Advance diet Up with therapy D/C IV fluids Discharge home with home health Instr given.  Shirrell Solinger C 07/15/2017, 7:33 AM

## 2017-07-15 NOTE — Evaluation (Signed)
Physical Therapy Evaluation Patient Details Name: [name] MRN: [id number] DOB: Dec 05, 1965 Today's Date: 07/15/2017   History of Present Illness  s/p L3-4, L4-5 decompression, and removal of epidural lipomatosis L3, L4, L5  Clinical Impression  Pt admitted with above diagnosis. Pt currently with functional limitations due to the deficits listed below (see PT Problem List). * Pt will benefit from skilled PT to increase their independence and safety with mobility to allow discharge to the venue listed below.   Pt will benefit from one to two more sessions for further gait/stair/mobility practice, continuing to require cues for back precautions; will see in am    Follow Up Recommendations No PT follow up    Equipment Recommendations  Rolling walker with 5" wheels    Recommendations for Other Services       Precautions / Restrictions Precautions Precautions: Back;Fall Restrictions Weight Bearing Restrictions: No      Mobility  Bed Mobility Overal bed mobility: Needs Assistance Bed Mobility: Sit to Sidelying         Sit to sidelying: Min guard;Supervision General bed mobility comments: cues fo rlog roll, avoid twisting and follow back precautions  Transfers Overall transfer level: Needs assistance Equipment used: Rolling walker (2 wheeled) Transfers: Sit to/from Stand Sit to Stand: Min guard         General transfer comment: for safety; cues for UE placement  Ambulation/Gait   Ambulation Distance (Feet): 160 Feet Assistive device: Rolling walker (2 wheeled) Gait Pattern/deviations: Step-through pattern Gait velocity: decr   General Gait Details: cues for posture/improved spinal alignment; cues for RW safety  Stairs            Wheelchair Mobility    Modified Rankin (Stroke Patients Only)       Balance             Standing balance-Leahy Scale: Fair Standing balance comment: reliant on UEs for dynamic activity                              Pertinent Vitals/Pain Pain Assessment: 0-10 Pain Score: 3  Pain Location: back Pain Descriptors / Indicators: Sore Pain Intervention(s): Monitored during session;Premedicated before session;Repositioned    Home Living Family/patient expects to be discharged to:: Private residence Living Arrangements: Spouse/significant other   Type of Home: House Home Access: Stairs to enter   Technical brewer of Steps: 2 Home Layout: Able to live on main level with bedroom/bathroom   Additional Comments: vanity next to commode. Pt will sponge bathe as tub is upstairs    Prior Function Level of Independence: Independent         Comments: pt reports lots of pain prior to surgery and limited activity/standing tolerance     Hand Dominance        Extremity/Trunk Assessment   Upper Extremity Assessment Upper Extremity Assessment: Defer to OT evaluation;Overall [facility] for tasks assessed    Lower Extremity Assessment Lower Extremity Assessment: Overall WFL for tasks assessed (moves lsowly d/t stiffness in joints)       Communication   Communication: No difficulties  Cognition Arousal/Alertness: Awake/alert Behavior During Therapy: WFL for tasks assessed/performed Overall Cognitive Status: Within Functional Limits for tasks assessed                                        General Comments  Exercises     Assessment/Plan    PT Assessment Patient needs continued PT services  PT Problem List Decreased mobility;Decreased knowledge of precautions;Decreased knowledge of use of DME;Pain       PT Treatment Interventions DME instruction;Gait training;Functional mobility training;Therapeutic exercise;Patient/family education;Stair training    PT Goals (Current goals can be found in the Care Plan section)  Acute Rehab PT Goals Patient Stated Goal: have a good recovery PT Goal Formulation: With patient Time For Goal Achievement:  07/19/17 Potential to Achieve Goals: Good    Frequency 7X/week   Barriers to discharge        Co-evaluation               AM-PAC PT "6 Clicks" Daily Activity  Outcome Measure Difficulty turning over in bed (including adjusting bedclothes, sheets and blankets)?: A Little Difficulty moving from lying on back to sitting on the side of the bed? : Unable Difficulty sitting down on and standing up from a chair with arms (e.g., wheelchair, bedside commode, etc,.)?: A Little Help needed moving to and from a bed to chair (including a wheelchair)?: A Little Help needed walking in hospital room?: A Little Help needed climbing 3-5 steps with a railing? : A Little 6 Click Score: 16    End of Session Equipment Utilized During Treatment:  (mild nausea) Activity Tolerance: Patient tolerated treatment well Patient left: in bed;with call bell/phone within reach;with family/visitor present   PT Visit Diagnosis: Difficulty in walking, not elsewhere classified (R26.2);Pain Pain - part of body:  (back)    Time: 3419-6222 PT Time Calculation (min) (ACUTE ONLY): 19 min   Charges:   PT Evaluation $PT Eval Low Complexity: 1 Low     PT G Codes:   PT G-Codes **NOT FOR INPATIENT CLASS** Functional Assessment Tool Used: AM-PAC 6 Clicks Basic Mobility;Clinical judgement Functional Limitation: Mobility: Walking and moving around Mobility: Walking and Moving Around Current Status (L7989): At least 1 percent but less than 20 percent impaired, limited or restricted Mobility: Walking and Moving Around Goal Status [phone]): At least 1 percent but less than 20 percent impaired, limited or restricted    Kenyon Ana, PT Pager: [phone] 07/15/2017   [facility] 07/15/2017, 12:42 PM

## 2017-07-15 NOTE — Progress Notes (Signed)
Pt. seen for CPAP h/s, stated,"able to apply independently", made aware to notify if needed.

## 2017-07-15 NOTE — Progress Notes (Signed)
Patient attempted to scratch back, IV got stuck on dressing and pulled dressing off. Placed new aquacel dressing.

## 2017-07-15 NOTE — Discharge Summary (Addendum)
Physician Discharge Summary   Patient ID: [name] MRN: [id number] DOB/AGE: [birth year] 51 y.o.  Admit date: 07/14/2017 Discharge date: 07/16/2017  Primary Diagnosis:   Spinal Stenosis   Admission Diagnoses:  Past Medical History:  Diagnosis Date  . Headache   . Heart murmur    "years ago" ; denies symptom   . Hypertension   . Left ear hearing loss   . Sleep apnea    Discharge Diagnoses:   Active Problems:   Spinal stenosis at L4-L5 level  Procedure:  Procedure(s) (LRB): CENTRAL MICROLUMBAR DECOMPRESSION OF L3-L4 AND L4-L5,  AND EXCISION OF LIPOMA (N/A)   Consults: None  HPI:  see H&P    Laboratory Data: Hospital Outpatient Visit on 07/07/2017  Component Date Value Ref Range Status  . Sodium 07/07/2017 137  135 - 145 mmol/L Final  . Potassium 07/07/2017 4.3  3.5 - 5.1 mmol/L Final  . Chloride 07/07/2017 99* 101 - 111 mmol/L Final  . CO2 07/07/2017 29  22 - 32 mmol/L Final  . Glucose, Bld 07/07/2017 111* 65 - 99 mg/dL Final  . BUN 07/07/2017 17  6 - 20 mg/dL Final  . Creatinine, Ser 07/07/2017 1.05  0.61 - 1.24 mg/dL Final  . Calcium 07/07/2017 9.5  8.9 - 10.3 mg/dL Final  . GFR calc non Af Amer 07/07/2017 >60  >60 mL/min Final  . GFR calc Af Amer 07/07/2017 >60  >60 mL/min Final   Comment: (NOTE) The eGFR has been calculated using the CKD EPI equation. This calculation has not been validated in all clinical situations. eGFR's persistently <60 mL/min signify possible Chronic Kidney Disease.   . Anion gap 07/07/2017 9  5 - 15 Final  . WBC 07/07/2017 7.3  4.0 - 10.5 K/uL Final  . RBC 07/07/2017 5.13  4.22 - 5.81 MIL/uL Final  . Hemoglobin 07/07/2017 15.7  13.0 - 17.0 g/dL Final  . HCT 07/07/2017 44.4  39.0 - 52.0 % Final  . MCV 07/07/2017 86.5  78.0 - 100.0 fL Final  . MCH 07/07/2017 30.6  26.0 - 34.0 pg Final  . MCHC 07/07/2017 35.4  30.0 - 36.0 g/dL Final  . RDW 07/07/2017 13.2  11.5 - 15.5 % Final  . Platelets 07/07/2017 286  150 - 400 K/uL  Final  . MRSA, PCR 07/07/2017 NEGATIVE  NEGATIVE Final  . Staphylococcus aureus 07/07/2017 NEGATIVE  NEGATIVE Final   Comment:        The Xpert SA Assay (FDA approved for NASAL specimens in patients over 7 years of age), is one component of a comprehensive surveillance program.  Test performance has been validated by [facility] for patients greater than or equal to 51 year old. It is not intended to diagnose infection nor to guide or monitor treatment.    No results for input(s): HGB in the last 72 hours. No results for input(s): WBC, RBC, HCT, PLT in the last 72 hours. No results for input(s): NA, K, CL, CO2, BUN, CREATININE, GLUCOSE, CALCIUM in the last 72 hours. No results for input(s): LABPT, INR in the last 72 hours.  X-Rays:Dg Lumbar Spine 2-3 Views  Result Date: 07/07/2017 CLINICAL DATA:  Lumbar HNP preop evaluation.  Please number vertebra EXAM: LUMBAR SPINE - 2-3 VIEW COMPARISON:  CT myelogram 06/10/2017 FINDINGS: Five lumbar segments. The lowest disc space is L5-S1 consistent with prior MRI Mild anterolisthesis L5-S1. Mild disc space narrowing L4-5. Negative for fracture or pars defect. Mild atherosclerotic calcification in the abdominal aorta. IMPRESSION: Lumbar vertebra number  for preoperative planning. Mild disc space narrowing L4-5. Mild anterolisthesis L5-S1 Electronically Signed   By: Franchot Gallo M.D.   On: 07/07/2017 10:34   Dg Spine Portable 1 View  Result Date: 07/14/2017 CLINICAL DATA:  Lumbar disc disease. EXAM: PORTABLE SPINE - 1 VIEW COMPARISON:  Radiographs dated 05/07/2017 FINDINGS: There are instruments at the L4-5 level and at the L5-S1 level. IMPRESSION: Instruments at L4-5 and L5-S1. Electronically Signed   By: Lorriane Shire M.D.   On: 07/14/2017 09:18   Dg Spine Portable 1 View  Result Date: 07/14/2017 CLINICAL DATA:  51 year old male undergoing lumbar spine surgery. EXAM: PORTABLE SPINE - 1 VIEW COMPARISON:  0743 hours the same day, and earlier  including lumbar radiographs 07/07/2017 and CT lumbar myelogram 06/10/2017. FINDINGS: Hypoplastic ribs at T12 and full size ribs at T11 corresponding to the same numbering systems used on 07/07/2017 and 06/10/2017. Intraoperative portable cross-table lateral view of the lumbar spine labeled image #2 at 0800 hours. There is a surgical clamp on the L4 spinous process. Surgical probes have been advanced to the L5 pedicle and mid S1 levels. IMPRESSION: Intraoperative localization at L4, L5, and S1. Electronically Signed   By: Genevie Ann M.D.   On: 07/14/2017 08:27   Dg Spine Portable 1 View  Result Date: 07/14/2017 CLINICAL DATA:  New lumbar spine surgery . EXAM: PORTABLE SPINE - 1 VIEW COMPARISON:  07/07/2017 . FINDINGS: Lumbar spine numbered as per prior exam. Metallic markers noted posteriorly at the L3-L4, L4-L5, and L5-S1 levels. Diffuse degenerative change lumbar spine IMPRESSION: Metallic markers noted posteriorly at the L3-L4, L4-L5, and L5-S1 levels. Electronically Signed   By: Marcello Moores  Register   On: 07/14/2017 08:02    EKG:No orders found for this or any previous visit.   Hospital Course: Patient was admitted to [facility] and taken to the OR and underwent the above state procedure without complications.  Patient tolerated the procedure well and was later transferred to the recovery room and then to the orthopaedic floor for postoperative care.  They were given PO and IV analgesics for pain control following their surgery.  They were given 24 hours of postoperative antibiotics.   PT was consulted postop to assist with mobility and transfers.  The patient was allowed to be WBAT with therapy and was taught back precautions. Discharge planning was consulted to help with postop disposition and equipment needs.  Patient had a good night on the evening of surgery and started to get up OOB with therapy on day one. Patient was seen in rounds daily and was ready to go home on day two. Discharge held on  day one due to nausea and dizziness due to hypotension which improved through the night.  They were given discharge instructions and dressing directions.  They were instructed on when to follow up in the office with Dr. Tonita Cong.   Diet: Regular diet Activity:WBAT; Lspine precautions Follow-up:in 10-14 days Disposition - Home Discharged Condition: good   Discharge Instructions    Call MD / Call 911    Complete by:  As directed    If you experience chest pain or shortness of breath, CALL 911 and be transported to the hospital emergency room.  If you develope a fever above 101 F, pus (white drainage) or increased drainage or redness at the wound, or calf pain, call your surgeon's office.   Constipation Prevention    Complete by:  As directed    Drink plenty of fluids.  Prune juice  may be helpful.  You may use a stool softener, such as Colace (over the counter) 100 mg twice a day.  Use MiraLax (over the counter) for constipation as needed.   Diet - low sodium heart healthy    Complete by:  As directed    Increase activity slowly as tolerated    Complete by:  As directed      Allergies as of 07/15/2017      Reactions   Lisinopril Swelling, Rash   Tramadol Other (See Comments)   "complete blackout;" can't remember a thing      Medication List    STOP taking these medications   cyclobenzaprine 10 MG tablet Commonly known as:  FLEXERIL   GOODY HEADACHE PO   OMEGA 3-6-9 COMPLEX PO     TAKE these medications   chlorthalidone 25 MG tablet Commonly known as:  HYGROTON Take 25 mg by mouth daily.   docusate sodium 100 MG capsule Commonly known as:  COLACE Take 1 capsule (100 mg total) by mouth 2 (two) times daily as needed for mild constipation.   methocarbamol 500 MG tablet Commonly known as:  ROBAXIN Take 1 tablet (500 mg total) by mouth every 6 (six) hours as needed for muscle spasms. What changed:  medication strength  how much to take  when to take this     oxyCODONE-acetaminophen 5-325 MG tablet Commonly known as:  PERCOCET Take 1-2 tablets by mouth every 4 (four) hours as needed for severe pain.   polyethylene glycol packet Commonly known as:  MIRALAX / GLYCOLAX Take 17 g by mouth daily.   zolpidem 10 MG tablet Commonly known as:  AMBIEN Take 10 mg by mouth at bedtime as needed for sleep.            Discharge Care Instructions        Start     Ordered   07/15/17 0000  Call MD / Call 911    Comments:  If you experience chest pain or shortness of breath, CALL 911 and be transported to the hospital emergency room.  If you develope a fever above 101 F, pus (white drainage) or increased drainage or redness at the wound, or calf pain, call your surgeon's office.   07/15/17 0734   07/15/17 0000  Diet - low sodium heart healthy     07/15/17 0734   07/15/17 0000  Constipation Prevention    Comments:  Drink plenty of fluids.  Prune juice may be helpful.  You may use a stool softener, such as Colace (over the counter) 100 mg twice a day.  Use MiraLax (over the counter) for constipation as needed.   07/15/17 0734   07/15/17 0000  Increase activity slowly as tolerated     07/15/17 0734   07/14/17 0000  methocarbamol (ROBAXIN) 500 MG tablet  Every 6 hours PRN     07/14/17 0932   07/14/17 0000  oxyCODONE-acetaminophen (PERCOCET) 5-325 MG tablet  Every 4 hours PRN     07/14/17 0932   07/14/17 0000  docusate sodium (COLACE) 100 MG capsule  2 times daily PRN     07/14/17 0932   07/14/17 0000  polyethylene glycol (MIRALAX / GLYCOLAX) packet  Daily     07/14/17 0932     Follow-up Information    Susa Day, MD Follow up in 2 week(s).   Specialty:  Orthopedic Surgery Contact information: [address] [place] [address] [phone]  Signed: Lacie Draft, PA-C Orthopaedic Surgery 07/15/2017, 10:13 AM

## 2017-07-16 DIAGNOSIS — M48062 Spinal stenosis, lumbar region with neurogenic claudication: Secondary | ICD-10-CM | POA: Diagnosis not present

## 2017-07-16 NOTE — Progress Notes (Signed)
Subjective: 2 Days Post-Op Procedure(s) (LRB): CENTRAL MICROLUMBAR DECOMPRESSION OF L3-L4 AND L4-L5,  AND EXCISION OF LIPOMA (N/A) Patient reports pain as moderate. Reports improvement in leg pain. C/o incisional pain. Dizziness and nausea from yesterday resolved. Feels ready to go home. Voiding without difficulty. Passing gas, no BM yet.   Objective: Vital signs in last 24 hours: Temp:  [98.4 F (36.9 C)-98.8 F (37.1 C)] 98.4 F (36.9 C) (08/24 0700) Pulse Rate:  [64-68] 65 (08/24 0700) Resp:  [16-18] 16 (08/24 0700) BP: (104-157)/(62-79) 112/72 (08/24 0700) SpO2:  [99 %-100 %] 100 % (08/24 0700)  Intake/Output from previous day: 08/23 0701 - 08/24 0700 In: 4505 [P.O.:1200; I.V.:3250; IV Piggyback:55] Out: 5 [Urine:5] Intake/Output this shift: No intake/output data recorded.  No results for input(s): HGB in the last 72 hours. No results for input(s): WBC, RBC, HCT, PLT in the last 72 hours. No results for input(s): NA, K, CL, CO2, BUN, CREATININE, GLUCOSE, CALCIUM in the last 72 hours. No results for input(s): LABPT, INR in the last 72 hours.  Neurologically intact ABD soft Neurovascular intact Sensation intact distally Intact pulses distally Dorsiflexion/Plantar flexion intact Incision: dressing C/D/I and no drainage No cellulitis present Compartment soft no calf pain or sign of DVT  Assessment/Plan: 2 Days Post-Op Procedure(s) (LRB): CENTRAL MICROLUMBAR DECOMPRESSION OF L3-L4 AND L4-L5,  AND EXCISION OF LIPOMA (N/A) Advance diet Up with therapy D/C IV fluids D/C home today Ok to shower before d/c Discussed D/C and dressing instructions, Lspine precautions Will discuss with Dr. Mliss Fritz, Con[place] Rolls. 07/16/2017, 8:13 AM

## 2017-07-16 NOTE — Progress Notes (Signed)
    Durable Medical Equipment        Start     Ordered   07/16/17 1006  For home use only DME Shower stool  Once    Comments:  Also a Toilet AIDE.   07/16/17 1006

## 2017-07-16 NOTE — Progress Notes (Signed)
Physical Therapy Treatment Patient Details Name: [name] MRN: [id number] DOB: January 15, 1966 Today's Date: 07/16/2017    History of Present Illness s/p L3-4, L4-5 decompression, and removal of epidural lipomatosis L3, L4, L5    PT Comments    Progressing well   Follow Up Recommendations  No PT follow up     Equipment Recommendations  Rolling walker with 5" wheels    Recommendations for Other Services       Precautions / Restrictions Precautions Precautions: Back;Fall Precaution Comments: pt verbalizes precautions Restrictions Weight Bearing Restrictions: No    Mobility  Bed Mobility Overal bed mobility: Modified Independent Bed Mobility: Sidelying to Sit;Sit to Sidelying;Rolling              Transfers Overall transfer level: Needs assistance Equipment used: Rolling walker (2 wheeled) Transfers: Sit to/from Stand Sit to Stand: Supervision;Modified independent (Device/Increase time)         General transfer comment: for safety; cues for UE placement  Ambulation/Gait Ambulation/Gait assistance: Supervision;Min guard Ambulation Distance (Feet): 240 Feet Assistive device: Rolling walker (2 wheeled) Gait Pattern/deviations: Step-through pattern;Wide base of support Gait velocity: decr   General Gait Details: cues for posture/improved spinal alignment; cues for RW safety   Stairs Stairs: Yes   Stair Management: Two rails;Step to pattern;Forwards Number of Stairs: 2 General stair comments: cues for sequence  Wheelchair Mobility    Modified Rankin (Stroke Patients Only)       Balance                                            Cognition Arousal/Alertness: Awake/alert Behavior During Therapy: WFL for tasks assessed/performed Overall Cognitive Status: Within Functional Limits for tasks assessed                                        Exercises      General Comments        Pertinent Vitals/Pain  Pain Assessment: 0-10 Pain Score: 3  Pain Location: back Pain Descriptors / Indicators: Discomfort Pain Intervention(s): Monitored during session    Home Living                      Prior Function            PT Goals (current goals can now be found in the care plan section) Acute Rehab PT Goals Patient Stated Goal: have a good recovery PT Goal Formulation: With patient Time For Goal Achievement: 07/19/17 Potential to Achieve Goals: Good Progress towards PT goals: Progressing toward goals    Frequency    7X/week      PT Plan Current plan remains appropriate    Co-evaluation              AM-PAC PT "6 Clicks" Daily Activity  Outcome Measure  Difficulty turning over in bed (including adjusting bedclothes, sheets and blankets)?: A Little Difficulty moving from lying on back to sitting on the side of the bed? : A Little Difficulty sitting down on and standing up from a chair with arms (e.g., wheelchair, bedside commode, etc,.)?: A Little Help needed moving to and from a bed to chair (including a wheelchair)?: A Little Help needed walking in hospital room?: A Little Help needed climbing 3-5 steps with a railing? :  A Little 6 Click Score: 18    End of Session Equipment Utilized During Treatment: Gait belt Activity Tolerance: Patient tolerated treatment well Patient left: in bed;with call bell/phone within reach;with family/visitor present   PT Visit Diagnosis: Difficulty in walking, not elsewhere classified (R26.2);Pain Pain - part of body:  (back)     Time: 3276-1470 PT Time Calculation (min) (ACUTE ONLY): 15 min  Charges:  $Gait Training: 8-22 mins                    G Codes:       Supriya Beaston 12-Aug-2017, 11:12 AM

## 2017-11-23 HISTORY — PX: APPENDECTOMY: SHX54

## 2019-01-19 DIAGNOSIS — M25562 Pain in left knee: Secondary | ICD-10-CM | POA: Insufficient documentation

## 2019-05-19 DIAGNOSIS — M545 Low back pain, unspecified: Secondary | ICD-10-CM | POA: Insufficient documentation

## 2019-06-15 HISTORY — PX: OTHER SURGICAL HISTORY: SHX169

## 2019-06-26 DIAGNOSIS — M5136 Other intervertebral disc degeneration, lumbar region: Secondary | ICD-10-CM | POA: Insufficient documentation

## 2019-06-27 DIAGNOSIS — G5620 Lesion of ulnar nerve, unspecified upper limb: Secondary | ICD-10-CM | POA: Insufficient documentation

## 2019-11-29 ENCOUNTER — Other Ambulatory Visit: Payer: Self-pay | Admitting: Orthopedic Surgery

## 2019-11-29 DIAGNOSIS — M259 Joint disorder, unspecified: Secondary | ICD-10-CM

## 2019-12-06 ENCOUNTER — Other Ambulatory Visit: Payer: Non-veteran care

## 2019-12-19 ENCOUNTER — Other Ambulatory Visit: Payer: Self-pay

## 2019-12-19 ENCOUNTER — Ambulatory Visit (HOSPITAL_COMMUNITY)
Admission: RE | Admit: 2019-12-19 | Discharge: 2019-12-19 | Disposition: A | Discharge status: Home / Self Care | Payer: No Typology Code available for payment source | Source: Ambulatory Visit | Attending: Orthopedic Surgery | Admitting: Orthopedic Surgery

## 2019-12-19 DIAGNOSIS — M259 Joint disorder, unspecified: Secondary | ICD-10-CM | POA: Diagnosis not present

## 2019-12-19 HISTORY — PX: IR FLUORO GUIDED NEEDLE PLC ASPIRATION/INJECTION LOC: IMG2395

## 2019-12-19 MED ORDER — METHYLPREDNISOLONE ACETATE 80 MG/ML IJ SUSP
INTRAMUSCULAR | Status: AC
  Filled 2019-12-19: qty 1

## 2019-12-19 MED ORDER — BUPIVACAINE HCL (PF) 0.25 % IJ SOLN
INTRAMUSCULAR | Status: AC
  Filled 2019-12-19: qty 30

## 2019-12-19 MED ORDER — METHYLPREDNISOLONE ACETATE 40 MG/ML IJ SUSP
INTRAMUSCULAR | Status: AC
  Filled 2019-12-19: qty 1

## 2019-12-19 MED ORDER — IOPAMIDOL (ISOVUE-M 200) INJECTION 41%
INTRAMUSCULAR | Status: AC
  Administered 2019-12-19: 1 mL
  Filled 2019-12-19: qty 10

## 2019-12-19 MED ORDER — BUPIVACAINE HCL (PF) 0.5 % IJ SOLN
30.0000 mL | Freq: Once | INTRAMUSCULAR | Status: AC
  Administered 2019-12-19: 09:00:00 0.5 mL
  Filled 2019-12-19: qty 30

## 2019-12-19 MED ORDER — LIDOCAINE HCL (PF) 1 % IJ SOLN
INTRAMUSCULAR | Status: AC
  Administered 2019-12-19: 09:00:00 3 mL
  Filled 2019-12-19: qty 30

## 2019-12-19 NOTE — Procedures (Signed)
  Procedure: R  S-I injection under fluoro bupivicaine  0.43ml EBL:   minimal Complications:  none immediate  See full dictation in [facility].  Dillard Cannon MD Main # [phone] Pager  [phone]

## 2020-01-01 DIAGNOSIS — M9904 Segmental and somatic dysfunction of sacral region: Secondary | ICD-10-CM | POA: Insufficient documentation

## 2020-04-05 ENCOUNTER — Other Ambulatory Visit: Payer: Self-pay

## 2020-04-05 ENCOUNTER — Ambulatory Visit: Payer: Self-pay | Admitting: Orthopedic Surgery

## 2020-04-08 ENCOUNTER — Other Ambulatory Visit: Payer: Self-pay

## 2020-04-08 DIAGNOSIS — G4730 Sleep apnea, unspecified: Secondary | ICD-10-CM | POA: Insufficient documentation

## 2020-04-08 DIAGNOSIS — F32A Depression, unspecified: Secondary | ICD-10-CM | POA: Insufficient documentation

## 2020-04-08 DIAGNOSIS — J309 Allergic rhinitis, unspecified: Secondary | ICD-10-CM | POA: Insufficient documentation

## 2020-04-08 DIAGNOSIS — I10 Essential (primary) hypertension: Secondary | ICD-10-CM | POA: Insufficient documentation

## 2020-04-16 ENCOUNTER — Other Ambulatory Visit: Payer: Self-pay

## 2020-04-16 ENCOUNTER — Encounter: Payer: Self-pay | Admitting: Vascular Surgery

## 2020-04-16 ENCOUNTER — Ambulatory Visit (INDEPENDENT_AMBULATORY_CARE_PROVIDER_SITE_OTHER): Payer: No Typology Code available for payment source | Admitting: Vascular Surgery

## 2020-04-16 VITALS — BP 142/90 | HR 76 | Temp 98.0°F | Resp 20 | Ht 69.0 in | Wt 204.0 lb

## 2020-04-16 DIAGNOSIS — M5136 Other intervertebral disc degeneration, lumbar region: Secondary | ICD-10-CM

## 2020-04-16 NOTE — Progress Notes (Signed)
[facility]  [name] MRN: [id number] DOB: 1966-03-29 Sex: male  REASON FOR CONSULT: Discuss oblique exposure for L4-5 fusion with Dr. Rolena Infante  HPI: Tysin Rarey is a 54 y.o. male, who is here today for discussion of anterior exposure through the oblique incision for L4-5 disc disease.  He has a long history of progressively severe disease.  Had a 4 5 discectomy several years ago with some relief but is now had recurrent pain.  He has seen Dr. Rolena Infante who is recommended interbody fusion with oblique and posterior approach.  Past Medical History:  Diagnosis Date  . Headache   . Heart murmur    "years ago" ; denies symptom   . Hypertension   . Left ear hearing loss   . Low back pain   . Radicular leg pain    Intermittent Right leg pain  . Sleep apnea     Family History  Problem Relation Age of Onset  . Hepatitis Mother   . Colon cancer Neg Hx     SOCIAL HISTORY: Social History   Socioeconomic History  . Marital status: Married    Spouse name: Not on file  . Number of children: Not on file  . Years of education: Not on file  . Highest education level: Not on file  Occupational History    Comment: No longer working  Tobacco Use  . Smoking status: Former Smoker    Types: Cigarettes    Quit date: 12/24/2012    Years since quitting: 7.3  . Smokeless tobacco: Never Used  Substance and Sexual Activity  . Alcohol use: Yes    Alcohol/week: 14.0 standard drinks    Types: 7 Glasses of wine, 7 Cans of beer per week  . Drug use: No  . Sexual activity: Not on file  Other Topics Concern  . Not on file  Social History Narrative  . Not on file   Social Determinants of Health   Financial Resource Strain:   . Difficulty of Paying Living Expenses:   Food Insecurity:   . Worried About Charity fundraiser in the Last Year:   . Arboriculturist in the Last Year:   Transportation  Needs:   . Film/video editor (Medical):   Marland Kitchen Lack of Transportation (Non-Medical):   Physical Activity:   . Days of Exercise per Week:   . Minutes of Exercise per Session:   Stress:   . Feeling of Stress :   Social Connections:   . Frequency of Communication with Friends and Family:   . Frequency of Social Gatherings with Friends and Family:   . Attends Religious Services:   . Active Member of Clubs or Organizations:   . Attends Archivist Meetings:   Marland Kitchen Marital Status:   Intimate Partner Violence:   . Fear of Current or Ex-Partner:   . Emotionally Abused:   Marland Kitchen Physically Abused:   . Sexually Abused:     Allergies  Allergen Reactions  . Lisinopril Swelling, Rash and Anaphylaxis  . Tramadol Other (See Comments)    "complete blackout;" can't remember a thing Extremely Violent, Blackout  . Gabapentin Itching    Current Outpatient Medications  Medication Sig Dispense Refill  . aspirin 81 MG EC tablet Take by mouth.    . chlorthalidone (HYGROTON) 25 MG tablet Take 25 mg by mouth daily.    Marland Kitchen FEXOFENADINE HCL PO fexofenadine    . methocarbamol (  ROBAXIN) 500 MG tablet Take 1 tablet (500 mg total) by mouth every 6 (six) hours as needed for muscle spasms. 40 tablet 1  . oxyCODONE-acetaminophen (PERCOCET) 5-325 MG tablet Take 1-2 tablets by mouth every 4 (four) hours as needed for severe pain. 40 tablet 0   No current facility-administered medications for this visit.    REVIEW OF SYSTEMS:  [X]  denotes positive finding, [ ]  denotes negative finding Cardiac  Comments:  Chest pain or chest pressure:    Shortness of breath upon exertion:    Short of breath when lying flat:    Irregular heart rhythm:        Vascular    Pain in calf, thigh, or hip brought on by ambulation:    Pain in feet at night that wakes you up from your sleep:     Blood clot in your veins:    Leg swelling:         Pulmonary    Oxygen at home:    Productive cough:     Wheezing:           Neurologic    Sudden weakness in arms or legs:     Sudden numbness in arms or legs:     Sudden onset of difficulty speaking or slurred speech:    Temporary loss of vision in one eye:     Problems with dizziness:         Gastrointestinal    Blood in stool:     Vomited blood:         Genitourinary    Burning when urinating:     Blood in urine:        Psychiatric    Major depression:         Hematologic    Bleeding problems:    Problems with blood clotting too easily:        Skin    Rashes or ulcers:        Constitutional    Fever or chills:      PHYSICAL EXAM: Vitals:   04/16/20 1032  BP: (!) 142/90  Pulse: 76  Resp: 20  Temp: 98 F (36.7 C)  SpO2: 96%  Weight: 92.5 kg  Height: 5\' 9"  (1.753 m)    GENERAL: The patient is a well-nourished male, in no acute distress. The vital signs are documented above. CARDIOVASCULAR: 2+ radial and 2+ dorsalis pedis pulses bilaterally PULMONARY: There is good air exchange  ABDOMEN: Soft and non-tender  MUSCULOSKELETAL: There are no major deformities or cyanosis. NEUROLOGIC: No focal weakness or paresthesias are detected. SKIN: There are no ulcers or rashes noted. PSYCHIATRIC: The patient has a normal affect.  DATA:  I have a lumbar spine CT from review from 3 years ago.  This reveals normal location of arterial and venous bifurcation.  Mild posterior plaque in the aorta above the bifurcation  MEDICAL ISSUES: Had long discussion with the patient regarding my role in exposure for fusion.  Discussed mobilization of intraperitoneal contents, left ureter and arterial venous structures overlying the spine and the potential injury for all of these.  He is not obese.  He has had an appendectomy several years ago but no other intra-abdominal surgery.  CT scan shows minimal atherosclerotic plaque.  I do not see any contraindications to oblique exposure and will proceed as planned on 05/01/2020   Rosetta Posner, MD [facility] Tel [phone] Pager [phone]

## 2020-04-19 ENCOUNTER — Ambulatory Visit: Payer: Self-pay | Admitting: Orthopedic Surgery

## 2020-04-19 NOTE — H&P (Signed)
Subjective:   [name] is a very pleasant active 54 year old gentleman has had long-standing low back pain and intermittent radicular right leg pain for several years now. He has had previous epidural injections, intradiscal injections, physical therapy, chiropractic treatment and activity modification. Despite all these modalities he only intervention which provided temporary significant relief was the recent L4-5 intradiscal steroid injection. According to the patient and his wife he slept for about 2 days because of significant pain relief. He noted improvement in his quality-of-life and decreased need for medications. Unfortunately no other treatment modality has provided any significant relief for him. At this point time we have discussed ongoing nonoperative treatment and he would like to move forward with surgery. .  Patient Active Problem List   Diagnosis Date Noted  . Allergic rhinitis 04/08/2020  . Depression 04/08/2020  . Hypertension 04/08/2020  . Sleep apnea 04/08/2020  . Somatic dysfunction of right sacroiliac joint 01/01/2020  . Cubital tunnel syndrome 06/27/2019  . 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 Lumbar pain 05/19/2019  . Pain in left knee 01/19/2019  . Lumbar post-laminectomy syndrome 07/14/2017   Past Medical History:  Diagnosis Date  . Headache   . Heart murmur    "years ago" ; denies symptom   . Hypertension   . Left ear hearing loss   . Low back pain   . Radicular leg pain    Intermittent Right leg pain  . Sleep apnea     Past Surgical History:  Procedure Laterality Date  . APPENDECTOMY  11/23/2017  . BILATERAL KNEE ARTHROSCOPY    . c5-6     removal of fusion and spacers  . CERVICAL SPINE SURGERY     2015,OCTOBER  . COLONOSCOPY    . IR FLUORO GUIDED NEEDLE PLC ASPIRATION/INJECTION LOC  12/19/2019  . LUMBAR LAMINECTOMY/DECOMPRESSION MICRODISCECTOMY N/A 07/14/2017   Procedure: CENTRAL MICROLUMBAR DECOMPRESSION OF L3-L4 AND L4-L5,  AND  EXCISION OF LIPOMA;  Surgeon: Susa Day, MD;  Location: [facility];  Service: Orthopedics;  Laterality: N/A;  . ORTHOPAEDIC SURGERY  06/15/2019    Current Outpatient Medications  Medication Sig Dispense Refill Last Dose  . acetaminophen (TYLENOL) 500 MG tablet Take 500-1,500 mg by mouth every 8 (eight) hours as needed for moderate pain or headache.     Marland Kitchen azelastine (ASTELIN) 0.1 % nasal spray Place 1 spray into both nostrils daily. Use in each nostril as directed     . chlorthalidone (HYGROTON) 25 MG tablet Take 12.5 mg by mouth daily.      . diclofenac Sodium (VOLTAREN) 1 % GEL Apply 1 application topically 4 (four) times daily as needed (pain).     . fexofenadine (ALLEGRA) 180 MG tablet Take 180 mg by mouth daily.     Marland Kitchen HYDROcodone-acetaminophen (NORCO/VICODIN) 5-325 MG tablet Take 1 tablet by mouth 2 (two) times daily as needed for moderate pain.     . Menthol-Methyl Salicylate (MUSCLE RUB EX) Apply 1 application topically daily as needed (pain).     . methocarbamol (ROBAXIN) 500 MG tablet Take 1 tablet (500 mg total) by mouth every 6 (six) hours as needed for muscle spasms. (Patient taking differently: Take 500 mg by mouth 3 (three) times daily as needed for muscle spasms. ) 40 tablet 1    No current facility-administered medications for this visit.   Allergies  Allergen Reactions  . Lisinopril Swelling, Rash and Anaphylaxis  . Tramadol Other (See Comments)    "complete blackout;" can't remember a thing Extremely Violent, Blackout  .  Gabapentin Itching    Social History   Tobacco Use  . Smoking status: Former Smoker    Types: Cigarettes    Quit date: 12/24/2012    Years since quitting: 7.3  . Smokeless tobacco: Never Used  Substance Use Topics  . Alcohol use: Yes    Alcohol/week: 14.0 standard drinks    Types: 7 Glasses of wine, 7 Cans of beer per week    Family History  Problem Relation Age of Onset  . Hepatitis Mother   . Colon cancer Neg Hx     Review of Systems As  stated in HPI  Objective:   Vitals: Ht: 5 ft 9 in 04/19/2020 10:33 am BP: 136/82 sitting L arm 04/19/2020 10:34 am Pulse: 63 bpm 04/19/2020 10:34 am  Heart: Regular rate and rhythm, no rubs, murmurs, or gallops  Lungs: Clear to auscultation bilaterally  Abdomen: Bowel sounds 4, nondistended, nontender, no rebound tenderness no loss of bladder or bowel control.  He continues to have severe debilitating back pain. He notes decreased range of motion because of pain. He continues to have radicular right dysesthesias and pain. No focal motor deficits on clinical exam.  Lumbar MRI: completed on 01/09/20 was reviewed with the patient. It was completed at [facility] I have independently reviewed the images as well as the radiology report. No disc herniation or central or foraminal stenosis L1 to or L2-3. Mild disc bulge at L3-4 with moderate foraminal stenosis. Previous central laminectomy is noted at L4-5 with central disc protrusion. Moderate foraminal stenosis, positive degenerative signal changes within the disc. L5-S1: Mild disc bulge with by foraminal extension. No significant stenosis.  Assessment:   Degenerative disc disease most notably at L4-5 there is moderate foraminal stenosis. causing low back pain and radicular right leg pain.  Previous central laminectomy At L4- 5  Plan:   Oblique lumbar interbody fusion L4-5 with posterior supplemental fusion instrumentation.  Dr. Donnetta Hutching for the approach.  Risks and benefits of surgery were discussed with the patient. These include: Infection, bleeding, death, stroke, paralysis, ongoing or worse pain, need for additional surgery, nonunion, leak of spinal fluid, adjacent segment degeneration requiring additional fusion surgery, need for posterior decompression and/or fusion. Bleeding from major vessels, and blood clots (deep venous thrombosis)requiring additional treatment. Due to the abdominal contents requiring further intervention, loss  in bowel and bladder control. Additional risk for male patients: Retrograde ejaculation, and therefore infertility.  We have obtained preoperative medical clearance from his primary care provider.  He has also been seen and evaluated by Dr. early, the approach surgeon, who did not see any contraindications to the surgery as planned.  I have reviewed the patient's medication list.  He is not on any blood thinners, not on any aspirin.  He is not taking any anti-inflammatory medications and I did advise him not to use any over-the-counter anti-inflammatory medications leading up to surgery.  We have also discussed the post-operative recovery period to include: bathing/showering restrictions, wound healing, activity (and driving) restrictions, medications/pain mangement.  We have also discussed post-operative redflags to include: signs and symptoms of postoperative infection, DVT/PE.  Patient was given an LSO brace at today's office visit  All patients questions were invited and answered  Follow-up: 2 weeks postop

## 2020-04-19 NOTE — H&P (Deleted)
  The note originally documented on this encounter has been moved the the encounter in which it belongs.  

## 2020-04-24 NOTE — Pre-Procedure Instructions (Signed)
Your procedure is scheduled on Wednesday, May 01, 2020 from 08:30 AM- 1:35 PM.  Report to [facility] Main Entrance "A" at 06:30 A.M., and check in at the Admitting office.  Call this number if you have problems the morning of surgery:  [phone]  Call [phone] if you have any questions prior to your surgery date Monday-Friday 8am-4pm.    Remember:  Do not eat or drink after midnight the night before your surgery.    Take these medicines the morning of surgery with A SIP OF WATER: fexofenadine (ALLEGRA)  IF NEEDED: acetaminophen (TYLENOL) HYDROcodone-acetaminophen (NORCO/VICODIN) methocarbamol (ROBAXIN) azelastine (ASTELIN) nasal spray   As of today, STOP taking any Aspirin (unless otherwise instructed by your surgeon) and Aspirin containing products, diclofenac Sodium (VOLTAREN) gel, Aleve, Naproxen, Ibuprofen, Motrin, Advil, Goody's, BC's, all herbal medications, fish oil, and all vitamins.          The Morning of Surgery:            Do not wear jewelry.            Do not wear lotions, powders, colognes, or deodorant.            Men may shave face and neck.            Do not bring valuables to the hospital.            [facility] is not responsible for any belongings or valuables.  Do NOT Smoke (Tobacco/Vapping) or drink Alcohol 24 hours prior to your procedure.  If you use a CPAP at night, you may bring all equipment for your overnight stay.   Contacts, glasses, dentures or bridgework may not be worn into surgery.      For patients admitted to the hospital, discharge time will be determined by your treatment team.   Patients discharged the day of surgery will not be allowed to drive home, and someone needs to stay with them for 24 hours.    Special instructions:   [facility]- Preparing For Surgery  Before surgery, you can play an important role. Because skin is not sterile, your skin needs to be as free of germs as possible. You can reduce the number of germs  on your skin by washing with CHG (chlorahexidine gluconate) Soap before surgery.  CHG is an antiseptic cleaner which kills germs and bonds with the skin to continue killing germs even after washing.    Oral Hygiene is also important to reduce your risk of infection.  Remember - BRUSH YOUR TEETH THE MORNING OF SURGERY WITH YOUR REGULAR TOOTHPASTE  Please do not use if you have an allergy to CHG or antibacterial soaps. If your skin becomes reddened/irritated stop using the CHG.  Do not shave (including legs and underarms) for at least 48 hours prior to first CHG shower. It is OK to shave your face.  Please follow these instructions carefully.   1. Shower the NIGHT BEFORE SURGERY and the MORNING OF SURGERY with CHG Soap.   2. If you chose to wash your hair, wash your hair first as usual with your normal shampoo.  3. After you shampoo, rinse your hair and body thoroughly to remove the shampoo.  4. Use CHG as you would any other liquid soap. You can apply CHG directly to the skin and wash gently with a scrungie or a clean washcloth.   5. Apply the CHG Soap to your body ONLY FROM THE NECK DOWN.  Do not use on  open wounds or open sores. Avoid contact with your eyes, ears, mouth and genitals (private parts). Wash Face and genitals (private parts)  with your normal soap.   6. Wash thoroughly, paying special attention to the area where your surgery will be performed.  7. Thoroughly rinse your body with warm water from the neck down.  8. DO NOT shower/wash with your normal soap after using and rinsing off the CHG Soap.  9. Pat yourself dry with a CLEAN TOWEL.  10. Wear CLEAN PAJAMAS to bed the night before surgery, wear comfortable clothes the morning of surgery  11. Place CLEAN SHEETS on your bed the night of your first shower and DO NOT SLEEP WITH PETS.   Day of Surgery:   Do not apply any deodorants/lotions.  Please wear clean clothes to the hospital/surgery center.   Remember to brush  your teeth WITH YOUR REGULAR TOOTHPASTE.   Please read over the following fact sheets that you were given.

## 2020-04-25 ENCOUNTER — Ambulatory Visit (HOSPITAL_COMMUNITY)
Admission: RE | Admit: 2020-04-25 | Discharge: 2020-04-25 | Disposition: A | Discharge status: Home / Self Care | Payer: No Typology Code available for payment source | Source: Ambulatory Visit | Attending: Orthopedic Surgery | Admitting: Orthopedic Surgery

## 2020-04-25 ENCOUNTER — Encounter (HOSPITAL_COMMUNITY): Payer: Self-pay

## 2020-04-25 ENCOUNTER — Other Ambulatory Visit: Payer: Self-pay

## 2020-04-25 ENCOUNTER — Encounter (HOSPITAL_COMMUNITY)
Admission: RE | Admit: 2020-04-25 | Discharge: 2020-04-25 | Disposition: A | Discharge status: Home / Self Care | Payer: No Typology Code available for payment source | Source: Ambulatory Visit | Attending: Orthopedic Surgery | Admitting: Orthopedic Surgery

## 2020-04-25 DIAGNOSIS — Z01818 Encounter for other preprocedural examination: Secondary | ICD-10-CM | POA: Diagnosis not present

## 2020-04-25 HISTORY — DX: Depression, unspecified: F32.A

## 2020-04-25 HISTORY — DX: Anxiety disorder, unspecified: F41.9

## 2020-04-25 HISTORY — DX: Family history of other specified conditions: Z84.89

## 2020-04-25 HISTORY — DX: Unspecified osteoarthritis, unspecified site: M19.90

## 2020-04-25 HISTORY — DX: Other specified postprocedural states: Z98.890

## 2020-04-25 HISTORY — DX: Nausea with vomiting, unspecified: R11.2

## 2020-04-25 LAB — URINALYSIS, ROUTINE W REFLEX MICROSCOPIC
Bilirubin Urine: NEGATIVE
Glucose, UA: NEGATIVE mg/dL
Hgb urine dipstick: NEGATIVE
Ketones, ur: NEGATIVE mg/dL
Leukocytes,Ua: NEGATIVE
Nitrite: NEGATIVE
Protein, ur: NEGATIVE mg/dL
Specific Gravity, Urine: 1.009 (ref 1.005–1.030)
pH: 6 (ref 5.0–8.0)

## 2020-04-25 LAB — BASIC METABOLIC PANEL
Anion gap: 9 (ref 5–15)
BUN: 19 mg/dL (ref 6–20)
CO2: 25 mmol/L (ref 22–32)
Calcium: 9.2 mg/dL (ref 8.9–10.3)
Chloride: 104 mmol/L (ref 98–111)
Creatinine, Ser: 0.98 mg/dL (ref 0.61–1.24)
GFR calc Af Amer: >60 mL/min (ref >60)
GFR calc non Af Amer: >60 mL/min (ref >60)
Glucose, Bld: 110 mg/dL — ABNORMAL HIGH (ref 70–99)
Potassium: 3.8 mmol/L (ref 3.5–5.1)
Sodium: 138 mmol/L (ref 135–145)

## 2020-04-25 LAB — SURGICAL PCR SCREEN
MRSA, PCR: NEGATIVE
Staphylococcus aureus: NEGATIVE

## 2020-04-25 LAB — CBC
HCT: 46 % (ref 39.0–52.0)
Hemoglobin: 15.9 g/dL (ref 13.0–17.0)
MCH: 31.2 pg (ref 26.0–34.0)
MCHC: 34.6 g/dL (ref 30.0–36.0)
MCV: 90.2 fL (ref 80.0–100.0)
Platelets: 294 10*3/uL (ref 150–400)
RBC: 5.1 MIL/uL (ref 4.22–5.81)
RDW: 13.2 % (ref 11.5–15.5)
WBC: 6.8 10*3/uL (ref 4.0–10.5)
nRBC: 0 % (ref 0.0–0.2)

## 2020-04-25 LAB — PROTIME-INR
INR: 0.9 (ref 0.8–1.2)
Prothrombin Time: 11.8 seconds (ref 11.4–15.2)

## 2020-04-25 LAB — TYPE AND SCREEN
ABO/RH(D): A POS
Antibody Screen: NEGATIVE

## 2020-04-25 LAB — ABO/RH: ABO/RH(D): A POS

## 2020-04-25 LAB — APTT: aPTT: 30 seconds (ref 24–36)

## 2020-04-25 NOTE — Progress Notes (Signed)
PCP - Eliezer Lofts, NP  With [facility] Cardiologist - Denies  PPM/ICD - Denies  Chest x-ray - 04/25/20 EKG - 04/25/20 Stress Test - Denies ECHO - Per patient, done at [facility], unsure when or by who. Records requested. Cardiac Cath - Denies  Sleep Study - Yes, positive for OSA CPAP - No  Patient denies being a diabetic.  Blood Thinner Instructions: N/A Aspirin Instructions: N/A  ERAS Protcol - N/A PRE-SURGERY Ensure or G2- N/A  COVID TEST- 04/29/20 @1030  @ [place]   Anesthesia review: Yes, per MD order; abnormal EKG.  Patient denies shortness of breath, fever, cough and chest pain at PAT appointment   All instructions explained to the patient, with a verbal understanding of the material. Patient agrees to go over the instructions while at home for a better understanding. Patient also instructed to self quarantine after being tested for COVID-19. The opportunity to ask questions was provided.

## 2020-04-26 NOTE — Progress Notes (Addendum)
Anesthesia Chart Review:  Case: [id number] Date/Time: 05/01/20 0815   Procedures:      OBLIQUE LUMBAR INTERBODY FUSION (OLIF) L4-5 WITH POSTERIOR SPINAL FUSION INTERBODY (N/A ) - 5 hrs for both procedures Left sided tap block with exparel Dr. Donnetta Hutching to do approach     ABDOMINAL EXPOSURE (N/A )   Anesthesia type: General   Pre-op diagnosis: L4-5 fusion with post laminectomy syndrome   [facility] / [facility]   Surgeons: Melina Schools, MD; Early, Arvilla Meres, MD      DISCUSSION: Patient is a 54 year old male scheduled for the above procedure.  History includes former smoker (quit 12/24/12), post-operative N/V, OSA (not using CPAP), HTN, left ear hearing loss, PTSD, neck surgery (C5-7 ACDF ~ 2016; C4-7 posterior fusion 02/11/18), back surgery (L3-5 decompression;L4-5 laminotomies/foraminotomies/excision of epidural lipomatosis L4-S1 07/14/17)  Reviewed 02/11/18 [facility] anesthesia record for C4-7 PSF (see [place]). 7.5 mm ETT placed after 2 attempts using video laryngoscopy/Glidescope #4, cricoid pressure. Neck was maintained in neutral position. Cardiovascular exam was documented as "normal".   Preoperative EKG showed mild anterior T wave inversion. Currently, no comparison EKG available. I called and spoke with Mr. Koenigs. He is a English as a second language teacher, former Clinical cytogeneticist. He lives in [place] and had been seen either at the [facility] or [place] up until a few months ago when he transferred his [place] care to the [facility]. He denied any known cardiac issues. His grandfather had "heart issues" at age 37, but not family history cardiac issues in his parents. Mother was a smoker and died from cancer. He denied SOB, chest pain, palpitations, edema. Prior to back/leg issues he was able to bike and run 3 miles, but hasn't done so in > 1 year. He is able to push mow his 1/4-1/2 acre yard without CV symptoms. He stays overall active and walks as much as he can--knees bother  him if off of a flat surface. Planning to walk around the [facility] this weekend. He says a murmur was heard by one provider years ago, but not since. He think he may have had an echo years ago, but not sure. His wife is an Product/process development scientist at [employer]. [facility] authorization for surgery expires on 05/26/20. He has had 8 surgeries in about the past 2 years, last were a elbow surgery by Dr. Jeannie Fend at [facility] and previously an appendectomy at [facility]. He thinks the [facility] may have an old EKG at either [facility] [place], or [facility] in [place], [place].   Medical Clearance letter signed by Quay Burow, DO.   Will follow-up if old EKG received--in the interim, reviewed with anesthesiologist Suzette Battiest, MD. Patient without known personal history of coronary disease (and only family history was GF at age 47), non-smoker for > 5 years, no DM history, no CV symptoms, and METS > 4. Based on currently available information it is anticipated that he can proceed as planned. I'll plan to update note if any additional pertinent information received.   ADDENDUM 04/29/20 11:22 AM: 01/23/20 and 08/15/08 copies of EKG tracings received from the [facility]. They also show anterior-to-anterolateral T wave abnormality, so 04/25/20 EKG appears stable. As above, already discussed with anesthesiologist. In addition, patient called to report that he kayaked 7 miles this weekend and corrected that while he had not been running 3 miles very regularly in the past year, he had run three mile several  months ago--all of these activities without CV symptoms. His 04/29/20 COVID-19 test is in process. If negative, and otherwise not acute changes then I would anticipate that he could proceed as planned.      VS: BP 131/73   Pulse 73   Temp 36.9 C (Oral)   Resp 20   Ht 5\' 9"  (1.753 m)   Wt 93.7 kg   SpO2 99%   BMI 30.50 kg/m    PROVIDERS: Eliezer Lofts, NP is PCP ([facility])     LABS: Labs reviewed: Acceptable for surgery. (all labs ordered are listed, but only abnormal results are displayed)  Labs Reviewed  BASIC METABOLIC PANEL - Abnormal; Notable for the following components:      Result Value   Glucose, Bld 110 (*)    All other components within normal limits  URINALYSIS, ROUTINE W REFLEX MICROSCOPIC - Abnormal; Notable for the following components:   Color, Urine STRAW (*)    All other components within normal limits  SURGICAL PCR SCREEN  APTT  CBC  PROTIME-INR  TYPE AND SCREEN  ABO/RH     IMAGES: CXR 04/25/20: FINDINGS: Cardiac shadow is within normal limits. Mild aortic calcifications are seen. The lungs are clear. No bony abnormality is noted. Postsurgical changes in the cervical spine are seen. IMPRESSION: No acute abnormality noted.   EKG: 04/25/20: Normal sinus rhythm Anterior T wave inversion, consider ischemia No old tracing to compare Confirmed by Minus Breeding [phone]) on 04/25/2020 9:09:14 PM   CV: He thought he may have had an echo with the [facility], but their medical records department could not find on on file there.   Past Medical History:  Diagnosis Date  . Anxiety    ptsd  . Arthritis   . Depression   . Family history of adverse reaction to anesthesia    Grandfather "woke up confused"?   . Headache   . Heart murmur    "years ago" ; denies symptom   . Hypertension   . Left ear hearing loss   . Low back pain   . PONV (postoperative nausea and vomiting)   . Radicular leg pain    Intermittent Right leg pain  . Sleep apnea     Past Surgical History:  Procedure Laterality Date  . APPENDECTOMY  11/23/2017  . BACK SURGERY    . BILATERAL KNEE ARTHROSCOPY    . c5-6     removal of fusion and spacers  . CERVICAL SPINE SURGERY     2015,OCTOBER  . COLONOSCOPY    . IR FLUORO GUIDED NEEDLE PLC ASPIRATION/INJECTION LOC  12/19/2019  . LUMBAR LAMINECTOMY/DECOMPRESSION MICRODISCECTOMY  N/A 07/14/2017   Procedure: CENTRAL MICROLUMBAR DECOMPRESSION OF L3-L4 AND L4-L5,  AND EXCISION OF LIPOMA;  Surgeon: Susa Day, MD;  Location: [facility];  Service: Orthopedics;  Laterality: N/A;  . ORTHOPAEDIC SURGERY  06/15/2019    MEDICATIONS: . acetaminophen (TYLENOL) 500 MG tablet  . azelastine (ASTELIN) 0.1 % nasal spray  . chlorthalidone (HYGROTON) 25 MG tablet  . diclofenac Sodium (VOLTAREN) 1 % GEL  . fexofenadine (ALLEGRA) 180 MG tablet  . HYDROcodone-acetaminophen (NORCO/VICODIN) 5-325 MG tablet  . Menthol-Methyl Salicylate (MUSCLE RUB EX)  . methocarbamol (ROBAXIN) 500 MG tablet   No current facility-administered medications for this encounter.     Myra Gianotti, PA-C Surgical Short Stay/Anesthesiology [facility] Phone [phone] [facility] Phone [phone] 04/26/2020 4:39 PM

## 2020-04-29 ENCOUNTER — Other Ambulatory Visit (HOSPITAL_COMMUNITY)
Admission: RE | Admit: 2020-04-29 | Discharge: 2020-04-29 | Disposition: A | Discharge status: Home / Self Care | Payer: No Typology Code available for payment source | Source: Ambulatory Visit | Attending: Orthopedic Surgery | Admitting: Orthopedic Surgery

## 2020-04-29 NOTE — Anesthesia Preprocedure Evaluation (Addendum)
Anesthesia Evaluation  Patient identified by MRN, date of birth, ID band Patient awake    Reviewed: Allergy & Precautions, NPO status , Patient's Chart, lab work & pertinent test results  History of Anesthesia Complications (+) PONV and history of anesthetic complications  Airway Mallampati: II  TM Distance: >3 FB Neck ROM: Full    Dental no notable dental hx.    Pulmonary sleep apnea , former smoker,    Pulmonary exam normal        Cardiovascular hypertension, Pt. on medications Normal cardiovascular exam  EKG: 04/25/20: NSR, anterior T wave inversion (stable from previous)   Neuro/Psych  Headaches, Anxiety Depression Hx of ACDF     GI/Hepatic negative GI ROS, Neg liver ROS,   Endo/Other  negative endocrine ROS  Renal/GU negative Renal ROS  negative genitourinary   Musculoskeletal  (+) Arthritis ,   Abdominal   Peds  Hematology negative hematology ROS (+)   Anesthesia Other Findings Day of surgery medications reviewed with patient.  Reproductive/Obstetrics negative OB ROS                            Anesthesia Physical Anesthesia Plan  ASA: II  Anesthesia Plan: General   Post-op Pain Management: GA combined w/ Regional for post-op pain   Induction: Intravenous  PONV Risk Score and Plan: 4 or greater and Treatment may vary due to age or medical condition, Ondansetron, Dexamethasone, Midazolam and Propofol infusion  Airway Management Planned: Oral ETT and Video Laryngoscope Planned  Additional Equipment: Arterial line  Intra-op Plan:   Post-operative Plan: Extubation in OR  Informed Consent: I have reviewed the patients History and Physical, chart, labs and discussed the procedure including the risks, benefits and alternatives for the proposed anesthesia with the patient or authorized representative who has indicated his/her understanding and acceptance.     Dental advisory  given  Plan Discussed with: CRNA  Anesthesia Plan Comments: (PAT note written by Myra Gianotti, PA-C. EKG is stable when compared to 2019 tracing. )      Anesthesia Quick Evaluation

## 2020-04-30 LAB — SARS CORONAVIRUS 2 (TAT 6-24 HRS): SARS Coronavirus 2: NEGATIVE

## 2020-05-01 ENCOUNTER — Inpatient Hospital Stay (HOSPITAL_COMMUNITY): Payer: No Typology Code available for payment source

## 2020-05-01 ENCOUNTER — Encounter (HOSPITAL_COMMUNITY): Payer: Self-pay | Admitting: Orthopedic Surgery

## 2020-05-01 ENCOUNTER — Inpatient Hospital Stay (HOSPITAL_COMMUNITY)
Admission: RE | Admit: 2020-05-01 | Discharge: 2020-05-02 | DRG: 455 | Disposition: A | Discharge status: Home / Self Care | Payer: No Typology Code available for payment source | Attending: Orthopedic Surgery | Admitting: Orthopedic Surgery

## 2020-05-01 ENCOUNTER — Other Ambulatory Visit: Payer: Self-pay

## 2020-05-01 ENCOUNTER — Inpatient Hospital Stay (HOSPITAL_COMMUNITY): Payer: No Typology Code available for payment source | Admitting: Certified Registered"

## 2020-05-01 ENCOUNTER — Inpatient Hospital Stay (HOSPITAL_COMMUNITY): Payer: No Typology Code available for payment source | Admitting: Vascular Surgery

## 2020-05-01 ENCOUNTER — Inpatient Hospital Stay (HOSPITAL_COMMUNITY)
Admission: RE | Disposition: A | Discharge status: Home / Self Care | Payer: Self-pay | Source: Ambulatory Visit | Attending: Orthopedic Surgery

## 2020-05-01 DIAGNOSIS — G4730 Sleep apnea, unspecified: Secondary | ICD-10-CM | POA: Diagnosis present

## 2020-05-01 DIAGNOSIS — Z87891 Personal history of nicotine dependence: Secondary | ICD-10-CM

## 2020-05-01 DIAGNOSIS — F329 Major depressive disorder, single episode, unspecified: Secondary | ICD-10-CM | POA: Diagnosis present

## 2020-05-01 DIAGNOSIS — M4326 Fusion of spine, lumbar region: Secondary | ICD-10-CM | POA: Diagnosis present

## 2020-05-01 DIAGNOSIS — Z888 Allergy status to other drugs, medicaments and biological substances status: Secondary | ICD-10-CM | POA: Diagnosis not present

## 2020-05-01 DIAGNOSIS — M9909 Segmental and somatic dysfunction of abdomen and other regions: Secondary | ICD-10-CM | POA: Diagnosis present

## 2020-05-01 DIAGNOSIS — Z20822 Contact with and (suspected) exposure to covid-19: Secondary | ICD-10-CM | POA: Diagnosis present

## 2020-05-01 DIAGNOSIS — H9192 Unspecified hearing loss, left ear: Secondary | ICD-10-CM | POA: Diagnosis present

## 2020-05-01 DIAGNOSIS — Z981 Arthrodesis status: Secondary | ICD-10-CM

## 2020-05-01 DIAGNOSIS — M48061 Spinal stenosis, lumbar region without neurogenic claudication: Secondary | ICD-10-CM | POA: Diagnosis present

## 2020-05-01 DIAGNOSIS — Z79891 Long term (current) use of opiate analgesic: Secondary | ICD-10-CM | POA: Diagnosis not present

## 2020-05-01 DIAGNOSIS — Z79899 Other long term (current) drug therapy: Secondary | ICD-10-CM | POA: Diagnosis not present

## 2020-05-01 DIAGNOSIS — Z419 Encounter for procedure for purposes other than remedying health state, unspecified: Secondary | ICD-10-CM

## 2020-05-01 DIAGNOSIS — I10 Essential (primary) hypertension: Secondary | ICD-10-CM | POA: Diagnosis present

## 2020-05-01 DIAGNOSIS — Z01810 Encounter for preprocedural cardiovascular examination: Secondary | ICD-10-CM | POA: Diagnosis not present

## 2020-05-01 DIAGNOSIS — M5116 Intervertebral disc disorders with radiculopathy, lumbar region: Principal | ICD-10-CM | POA: Diagnosis present

## 2020-05-01 DIAGNOSIS — M961 Postlaminectomy syndrome, not elsewhere classified: Secondary | ICD-10-CM | POA: Diagnosis present

## 2020-05-01 DIAGNOSIS — M545 Low back pain: Secondary | ICD-10-CM | POA: Diagnosis present

## 2020-05-01 DIAGNOSIS — M5416 Radiculopathy, lumbar region: Secondary | ICD-10-CM | POA: Diagnosis not present

## 2020-05-01 DIAGNOSIS — J309 Allergic rhinitis, unspecified: Secondary | ICD-10-CM | POA: Diagnosis present

## 2020-05-01 HISTORY — PX: ABDOMINAL EXPOSURE: SHX5708

## 2020-05-01 SURGERY — OBLIQUE LUMBAR INTERBODY FUSION 1 LEVEL
Anesthesia: General | Site: Back

## 2020-05-01 MED ORDER — SUGAMMADEX SODIUM 200 MG/2ML IV SOLN
INTRAVENOUS | Status: DC | PRN
  Administered 2020-05-01: 160 mg via INTRAVENOUS

## 2020-05-01 MED ORDER — PROPOFOL 10 MG/ML IV BOLUS
INTRAVENOUS | Status: AC
  Filled 2020-05-01: qty 20

## 2020-05-01 MED ORDER — PROMETHAZINE HCL 25 MG/ML IJ SOLN: 6.2500 mg | INTRAMUSCULAR | Status: DC | PRN

## 2020-05-01 MED ORDER — MORPHINE SULFATE (PF) 2 MG/ML IV SOLN
2.0000 mg | INTRAVENOUS | Status: DC | PRN
  Administered 2020-05-01: 2 mg via INTRAVENOUS
  Filled 2020-05-01: qty 1

## 2020-05-01 MED ORDER — TRANEXAMIC ACID-NACL 1000-0.7 MG/100ML-% IV SOLN
INTRAVENOUS | Status: DC | PRN
  Administered 2020-05-01: 1000 mg via INTRAVENOUS

## 2020-05-01 MED ORDER — METHOCARBAMOL 500 MG PO TABS: 500.0000 mg | ORAL_TABLET | Freq: Three times a day (TID) | ORAL | 0 refills | Status: AC | PRN

## 2020-05-01 MED ORDER — SCOPOLAMINE 1 MG/3DAYS TD PT72
1.0000 | MEDICATED_PATCH | TRANSDERMAL | Status: DC
  Filled 2020-05-01: qty 1

## 2020-05-01 MED ORDER — ONDANSETRON HCL 4 MG PO TABS: 4.0000 mg | ORAL_TABLET | Freq: Four times a day (QID) | ORAL | Status: DC | PRN

## 2020-05-01 MED ORDER — MAGNESIUM CITRATE PO SOLN
1.0000 | Freq: Once | ORAL | Status: AC
  Administered 2020-05-01: 1 via ORAL
  Filled 2020-05-01: qty 296

## 2020-05-01 MED ORDER — CHLORHEXIDINE GLUCONATE 4 % EX LIQD: 60.0000 mL | Freq: Once | CUTANEOUS | Status: DC

## 2020-05-01 MED ORDER — ROCURONIUM BROMIDE 10 MG/ML (PF) SYRINGE
PREFILLED_SYRINGE | INTRAVENOUS | Status: DC | PRN
  Administered 2020-05-01: 20 mg via INTRAVENOUS
  Administered 2020-05-01: 40 mg via INTRAVENOUS
  Administered 2020-05-01: 20 mg via INTRAVENOUS
  Administered 2020-05-01: 60 mg via INTRAVENOUS
  Administered 2020-05-01: 30 mg via INTRAVENOUS

## 2020-05-01 MED ORDER — ONDANSETRON HCL 4 MG/2ML IJ SOLN
INTRAMUSCULAR | Status: DC | PRN
  Administered 2020-05-01: 4 mg via INTRAVENOUS

## 2020-05-01 MED ORDER — BUPIVACAINE HCL (PF) 0.25 % IJ SOLN
INTRAMUSCULAR | Status: AC
  Filled 2020-05-01: qty 30

## 2020-05-01 MED ORDER — EPINEPHRINE PF 1 MG/ML IJ SOLN
INTRAMUSCULAR | Status: DC | PRN
  Administered 2020-05-01: .15 mL

## 2020-05-01 MED ORDER — LACTATED RINGERS IV SOLN: INTRAVENOUS | Status: DC | PRN

## 2020-05-01 MED ORDER — HEMOSTATIC AGENTS (NO CHARGE) OPTIME
TOPICAL | Status: DC | PRN
  Administered 2020-05-01 (×2): 1

## 2020-05-01 MED ORDER — ONDANSETRON HCL 4 MG PO TABS: 4.0000 mg | ORAL_TABLET | Freq: Three times a day (TID) | ORAL | 0 refills | Status: DC | PRN

## 2020-05-01 MED ORDER — 0.9 % SODIUM CHLORIDE (POUR BTL) OPTIME
TOPICAL | Status: DC | PRN
  Administered 2020-05-01: 1000 mL

## 2020-05-01 MED ORDER — SCOPOLAMINE 1 MG/3DAYS TD PT72
MEDICATED_PATCH | TRANSDERMAL | Status: AC
  Administered 2020-05-01: 1.5 mg via TRANSDERMAL
  Filled 2020-05-01: qty 1

## 2020-05-01 MED ORDER — BUPIVACAINE HCL (PF) 0.25 % IJ SOLN
INTRAMUSCULAR | Status: DC | PRN
  Administered 2020-05-01: 30 mL

## 2020-05-01 MED ORDER — FENTANYL CITRATE (PF) 250 MCG/5ML IJ SOLN
INTRAMUSCULAR | Status: AC
  Filled 2020-05-01: qty 5

## 2020-05-01 MED ORDER — MENTHOL 3 MG MT LOZG: 1.0000 | LOZENGE | OROMUCOSAL | Status: DC | PRN

## 2020-05-01 MED ORDER — SODIUM CHLORIDE 0.9% FLUSH
3.0000 mL | Freq: Two times a day (BID) | INTRAVENOUS | Status: DC
  Administered 2020-05-01: 3 mL via INTRAVENOUS

## 2020-05-01 MED ORDER — SODIUM CHLORIDE 0.9% FLUSH: 3.0000 mL | INTRAVENOUS | Status: DC | PRN

## 2020-05-01 MED ORDER — CEFAZOLIN SODIUM-DEXTROSE 2-4 GM/100ML-% IV SOLN
2.0000 g | INTRAVENOUS | Status: AC
  Administered 2020-05-01 (×2): 2 g via INTRAVENOUS
  Filled 2020-05-01: qty 100

## 2020-05-01 MED ORDER — ACETAMINOPHEN 500 MG PO TABS
1000.0000 mg | ORAL_TABLET | Freq: Once | ORAL | Status: AC
  Administered 2020-05-01: 1000 mg via ORAL
  Filled 2020-05-01: qty 2

## 2020-05-01 MED ORDER — ARTIFICIAL TEARS OPHTHALMIC OINT
TOPICAL_OINTMENT | OPHTHALMIC | Status: AC
  Filled 2020-05-01: qty 3.5

## 2020-05-01 MED ORDER — METHOCARBAMOL 500 MG PO TABS
ORAL_TABLET | ORAL | Status: AC
  Filled 2020-05-01: qty 1

## 2020-05-01 MED ORDER — TRANEXAMIC ACID-NACL 1000-0.7 MG/100ML-% IV SOLN
INTRAVENOUS | Status: AC
  Filled 2020-05-01: qty 100

## 2020-05-01 MED ORDER — LIDOCAINE 2% (20 MG/ML) 5 ML SYRINGE
INTRAMUSCULAR | Status: AC
  Filled 2020-05-01: qty 5

## 2020-05-01 MED ORDER — HYDROMORPHONE HCL 1 MG/ML IJ SOLN
INTRAMUSCULAR | Status: AC
  Filled 2020-05-01: qty 1

## 2020-05-01 MED ORDER — LIDOCAINE 2% (20 MG/ML) 5 ML SYRINGE
INTRAMUSCULAR | Status: DC | PRN
  Administered 2020-05-01: 100 mg via INTRAVENOUS

## 2020-05-01 MED ORDER — THROMBIN 20000 UNITS EX SOLR: CUTANEOUS | Status: DC | PRN

## 2020-05-01 MED ORDER — ACETAMINOPHEN 650 MG RE SUPP: 650.0000 mg | RECTAL | Status: DC | PRN

## 2020-05-01 MED ORDER — MIDAZOLAM HCL 2 MG/2ML IJ SOLN
INTRAMUSCULAR | Status: DC | PRN
  Administered 2020-05-01: 2 mg via INTRAVENOUS

## 2020-05-01 MED ORDER — ROCURONIUM BROMIDE 10 MG/ML (PF) SYRINGE
PREFILLED_SYRINGE | INTRAVENOUS | Status: AC
  Filled 2020-05-01: qty 10

## 2020-05-01 MED ORDER — ACETAMINOPHEN 10 MG/ML IV SOLN
INTRAVENOUS | Status: DC | PRN
Start: 2020-05-01 — End: 2020-05-01
  Administered 2020-05-01: 1000 mg via INTRAVENOUS

## 2020-05-01 MED ORDER — BUPIVACAINE LIPOSOME 1.3 % IJ SUSP
INTRAMUSCULAR | Status: DC | PRN
  Administered 2020-05-01: 10 mL via PERINEURAL

## 2020-05-01 MED ORDER — OXYCODONE HCL 5 MG PO TABS
ORAL_TABLET | ORAL | Status: AC
  Filled 2020-05-01: qty 2

## 2020-05-01 MED ORDER — PROPOFOL 10 MG/ML IV BOLUS
INTRAVENOUS | Status: DC | PRN
  Administered 2020-05-01: 30 mg via INTRAVENOUS
  Administered 2020-05-01: 170 mg via INTRAVENOUS

## 2020-05-01 MED ORDER — HYDROMORPHONE HCL 1 MG/ML IJ SOLN
0.2500 mg | INTRAMUSCULAR | Status: DC | PRN
  Administered 2020-05-01 (×4): 0.5 mg via INTRAVENOUS

## 2020-05-01 MED ORDER — EPINEPHRINE PF 1 MG/ML IJ SOLN
INTRAMUSCULAR | Status: AC
  Filled 2020-05-01: qty 1

## 2020-05-01 MED ORDER — EPHEDRINE 5 MG/ML INJ
INTRAVENOUS | Status: AC
  Filled 2020-05-01: qty 10

## 2020-05-01 MED ORDER — MIDAZOLAM HCL 2 MG/2ML IJ SOLN
INTRAMUSCULAR | Status: AC
  Filled 2020-05-01: qty 2

## 2020-05-01 MED ORDER — OXYCODONE-ACETAMINOPHEN 10-325 MG PO TABS: 1.0000 | ORAL_TABLET | Freq: Four times a day (QID) | ORAL | 0 refills | Status: AC | PRN

## 2020-05-01 MED ORDER — FENTANYL CITRATE (PF) 250 MCG/5ML IJ SOLN
INTRAMUSCULAR | Status: DC | PRN
  Administered 2020-05-01 (×2): 50 ug via INTRAVENOUS
  Administered 2020-05-01: 100 ug via INTRAVENOUS
  Administered 2020-05-01 (×6): 50 ug via INTRAVENOUS

## 2020-05-01 MED ORDER — OXYCODONE HCL 5 MG PO TABS: 5.0000 mg | ORAL_TABLET | ORAL | Status: DC | PRN

## 2020-05-01 MED ORDER — ONDANSETRON HCL 4 MG/2ML IJ SOLN: 4.0000 mg | Freq: Four times a day (QID) | INTRAMUSCULAR | Status: DC | PRN

## 2020-05-01 MED ORDER — PHENOL 1.4 % MT LIQD: 1.0000 | OROMUCOSAL | Status: DC | PRN

## 2020-05-01 MED ORDER — LACTATED RINGERS IV SOLN: INTRAVENOUS | Status: DC

## 2020-05-01 MED ORDER — OXYCODONE HCL 5 MG PO TABS
10.0000 mg | ORAL_TABLET | ORAL | Status: DC | PRN
  Administered 2020-05-01 – 2020-05-02 (×8): 10 mg via ORAL
  Filled 2020-05-01 (×7): qty 2

## 2020-05-01 MED ORDER — CHLORHEXIDINE GLUCONATE 0.12 % MT SOLN
15.0000 mL | Freq: Once | OROMUCOSAL | Status: AC
  Administered 2020-05-01: 15 mL via OROMUCOSAL
  Filled 2020-05-01: qty 15

## 2020-05-01 MED ORDER — ACETAMINOPHEN 325 MG PO TABS
650.0000 mg | ORAL_TABLET | ORAL | Status: DC | PRN
  Administered 2020-05-01: 650 mg via ORAL
  Filled 2020-05-01: qty 2

## 2020-05-01 MED ORDER — DEXAMETHASONE SODIUM PHOSPHATE 10 MG/ML IJ SOLN
INTRAMUSCULAR | Status: AC
  Filled 2020-05-01: qty 1

## 2020-05-01 MED ORDER — CHLORTHALIDONE 25 MG PO TABS
12.5000 mg | ORAL_TABLET | Freq: Every day | ORAL | Status: DC
  Administered 2020-05-02: 12.5 mg via ORAL
  Filled 2020-05-01: qty 0.5

## 2020-05-01 MED ORDER — ACETAMINOPHEN 10 MG/ML IV SOLN
INTRAVENOUS | Status: AC
  Filled 2020-05-01: qty 100

## 2020-05-01 MED ORDER — DEXAMETHASONE SODIUM PHOSPHATE 10 MG/ML IJ SOLN
INTRAMUSCULAR | Status: DC | PRN
  Administered 2020-05-01: 10 mg via INTRAVENOUS

## 2020-05-01 MED ORDER — PROPOFOL 500 MG/50ML IV EMUL
INTRAVENOUS | Status: DC | PRN
Start: 2020-05-01 — End: 2020-05-01
  Administered 2020-05-01: 75 ug/kg/min via INTRAVENOUS

## 2020-05-01 MED ORDER — METHOCARBAMOL 500 MG PO TABS
500.0000 mg | ORAL_TABLET | Freq: Four times a day (QID) | ORAL | Status: DC | PRN
  Administered 2020-05-01 – 2020-05-02 (×4): 500 mg via ORAL
  Filled 2020-05-01 (×3): qty 1

## 2020-05-01 MED ORDER — METHOCARBAMOL 1000 MG/10ML IJ SOLN
500.0000 mg | Freq: Four times a day (QID) | INTRAVENOUS | Status: DC | PRN
  Filled 2020-05-01: qty 5

## 2020-05-01 MED ORDER — CEFAZOLIN SODIUM-DEXTROSE 2-4 GM/100ML-% IV SOLN
INTRAVENOUS | Status: AC
  Filled 2020-05-01: qty 100

## 2020-05-01 MED ORDER — CEFAZOLIN SODIUM-DEXTROSE 1-4 GM/50ML-% IV SOLN
1.0000 g | Freq: Three times a day (TID) | INTRAVENOUS | Status: AC
  Administered 2020-05-01 – 2020-05-02 (×2): 1 g via INTRAVENOUS
  Filled 2020-05-01 (×2): qty 50

## 2020-05-01 MED ORDER — THROMBIN (RECOMBINANT) 20000 UNITS EX SOLR
CUTANEOUS | Status: AC
  Filled 2020-05-01: qty 20000

## 2020-05-01 MED ORDER — FLEET ENEMA 7-19 GM/118ML RE ENEM: 1.0000 | ENEMA | Freq: Once | RECTAL | Status: DC | PRN

## 2020-05-01 MED ORDER — PHENYLEPHRINE 40 MCG/ML (10ML) SYRINGE FOR IV PUSH (FOR BLOOD PRESSURE SUPPORT)
PREFILLED_SYRINGE | INTRAVENOUS | Status: AC
  Filled 2020-05-01: qty 10

## 2020-05-01 MED ORDER — ONDANSETRON HCL 4 MG/2ML IJ SOLN
INTRAMUSCULAR | Status: AC
  Filled 2020-05-01: qty 2

## 2020-05-01 MED ORDER — PROPOFOL 1000 MG/100ML IV EMUL
INTRAVENOUS | Status: AC
  Filled 2020-05-01: qty 100

## 2020-05-01 MED ORDER — BUPIVACAINE-EPINEPHRINE 0.25% -1:200000 IJ SOLN: INTRAMUSCULAR | Status: DC | PRN

## 2020-05-01 MED ORDER — POLYETHYLENE GLYCOL 3350 17 G PO PACK
17.0000 g | PACK | Freq: Every day | ORAL | Status: DC | PRN
  Administered 2020-05-01: 17 g via ORAL
  Filled 2020-05-01: qty 1

## 2020-05-01 MED ORDER — SUCCINYLCHOLINE CHLORIDE 200 MG/10ML IV SOSY
PREFILLED_SYRINGE | INTRAVENOUS | Status: AC
  Filled 2020-05-01: qty 10

## 2020-05-01 MED ORDER — ORAL CARE MOUTH RINSE: 15.0000 mL | Freq: Once | OROMUCOSAL | Status: AC

## 2020-05-01 MED ORDER — BUPIVACAINE-EPINEPHRINE (PF) 0.5% -1:200000 IJ SOLN
INTRAMUSCULAR | Status: DC | PRN
  Administered 2020-05-01: 20 mL via PERINEURAL

## 2020-05-01 SURGICAL SUPPLY — 92 items
APPLIER CLIP 11 MED OPEN (CLIP) ×4
BLADE CLIPPER SURG (BLADE) ×4 IMPLANT
BLADE SURG 10 STRL SS (BLADE) ×4 IMPLANT
BONE VIVIGEN FORMABLE 5.4CC (Bone Implant) ×4 IMPLANT
CATH FOLEY 2WAY SLVR  5CC 16FR (CATHETERS) ×2
CATH FOLEY 2WAY SLVR 5CC 16FR (CATHETERS) ×2 IMPLANT
CLIP APPLIE 11 MED OPEN (CLIP) ×2 IMPLANT
CLIP LIGATING EXTRA MED SLVR (CLIP) ×4 IMPLANT
CLIP LIGATING EXTRA SM BLUE (MISCELLANEOUS) ×4 IMPLANT
CLIP NEUROVISION LG (CLIP) ×4 IMPLANT
CLOSURE STERI-STRIP 1/2X4 (GAUZE/BANDAGES/DRESSINGS) ×2
CLSR STERI-STRIP ANTIMIC 1/2X4 (GAUZE/BANDAGES/DRESSINGS) ×6 IMPLANT
COVER SURGICAL LIGHT HANDLE (MISCELLANEOUS) ×4 IMPLANT
COVER WAND RF STERILE (DRAPES) ×8 IMPLANT
DERMABOND ADVANCED (GAUZE/BANDAGES/DRESSINGS) ×2
DERMABOND ADVANCED .7 DNX12 (GAUZE/BANDAGES/DRESSINGS) ×2 IMPLANT
DRAPE C-ARM 42X72 X-RAY (DRAPES) ×8 IMPLANT
DRAPE C-ARMOR (DRAPES) ×4 IMPLANT
DRAPE INCISE IOBAN 66X45 STRL (DRAPES) ×4 IMPLANT
DRSG OPSITE POSTOP 4X6 (GAUZE/BANDAGES/DRESSINGS) ×8 IMPLANT
DURAPREP 26ML APPLICATOR (WOUND CARE) ×4 IMPLANT
ELECT BLADE 4.0 EZ CLEAN MEGAD (MISCELLANEOUS) ×8
ELECT PENCIL ROCKER SW 15FT (MISCELLANEOUS) ×4 IMPLANT
ELECT REM PT RETURN 9FT ADLT (ELECTROSURGICAL) ×4
ELECTRODE BLDE 4.0 EZ CLN MEGD (MISCELLANEOUS) ×4 IMPLANT
ELECTRODE REM PT RTRN 9FT ADLT (ELECTROSURGICAL) ×2 IMPLANT
GLOVE BIO SURGEON STRL SZ 6.5 (GLOVE) ×3 IMPLANT
GLOVE BIO SURGEONS STRL SZ 6.5 (GLOVE) ×1
GLOVE BIOGEL PI IND STRL 6.5 (GLOVE) ×2 IMPLANT
GLOVE BIOGEL PI IND STRL 8.5 (GLOVE) ×2 IMPLANT
GLOVE BIOGEL PI INDICATOR 6.5 (GLOVE) ×2
GLOVE BIOGEL PI INDICATOR 8.5 (GLOVE) ×2
GLOVE SS BIOGEL STRL SZ 7.5 (GLOVE) ×2 IMPLANT
GLOVE SS BIOGEL STRL SZ 8.5 (GLOVE) ×2 IMPLANT
GLOVE SUPERSENSE BIOGEL SZ 7.5 (GLOVE) ×2
GLOVE SUPERSENSE BIOGEL SZ 8.5 (GLOVE) ×2
GOWN STRL REUS W/ TWL LRG LVL3 (GOWN DISPOSABLE) ×6 IMPLANT
GOWN STRL REUS W/ TWL XL LVL3 (GOWN DISPOSABLE) ×4 IMPLANT
GOWN STRL REUS W/TWL 2XL LVL3 (GOWN DISPOSABLE) ×8 IMPLANT
GOWN STRL REUS W/TWL LRG LVL3 (GOWN DISPOSABLE) ×6
GOWN STRL REUS W/TWL XL LVL3 (GOWN DISPOSABLE) ×4
GUIDEWIRE NITINOL BEVEL TIP (WIRE) ×16 IMPLANT
HEMOSTAT SURGICEL 2X14 (HEMOSTASIS) ×4 IMPLANT
INSERT FOGARTY 61MM (MISCELLANEOUS) IMPLANT
INSERT FOGARTY SM (MISCELLANEOUS) IMPLANT
KIT BASIN OR (CUSTOM PROCEDURE TRAY) ×4 IMPLANT
KIT TURNOVER KIT B (KITS) ×4 IMPLANT
LOOP VESSEL MAXI BLUE (MISCELLANEOUS) IMPLANT
LOOP VESSEL MINI RED (MISCELLANEOUS) IMPLANT
MODULE EMG NEEDLE SSEP NVM5 (NEEDLE) ×4 IMPLANT
MODULE NVM5 NEXT GEN EMG (NEEDLE) ×4 IMPLANT
NEEDLE 22X1 1/2 (OR ONLY) (NEEDLE) ×4 IMPLANT
NEEDLE SPNL 18GX3.5 QUINCKE PK (NEEDLE) ×4 IMPLANT
NS IRRIG 1000ML POUR BTL (IV SOLUTION) ×12 IMPLANT
PACK LAMINECTOMY ORTHO (CUSTOM PROCEDURE TRAY) ×4 IMPLANT
PACK UNIVERSAL I (CUSTOM PROCEDURE TRAY) ×4 IMPLANT
PAD ARMBOARD 7.5X6 YLW CONV (MISCELLANEOUS) ×16 IMPLANT
PROBE BALL TIP NVM5 SNG USE (BALLOONS) ×4 IMPLANT
ROD RELINE MAS TI LORD 5.5X40 (Rod) ×8 IMPLANT
SCREW LOCK RELINE 5.5 TULIP (Screw) ×16 IMPLANT
SCREW RELINE MAS POLY 6.5X40MM (Screw) ×4 IMPLANT
SCREW RELINE RED 6.5X45MM POLY (Screw) ×12 IMPLANT
SPACER OLIF 12X55 6D (Spacer) ×4 IMPLANT
SPONGE INTESTINAL PEANUT (DISPOSABLE) ×4 IMPLANT
SPONGE LAP 18X18 RF (DISPOSABLE) IMPLANT
SPONGE LAP 18X18 X RAY DECT (DISPOSABLE) ×4 IMPLANT
SPONGE LAP 4X18 RFD (DISPOSABLE) ×4 IMPLANT
SPONGE SURGIFOAM ABS GEL 100 (HEMOSTASIS) ×4 IMPLANT
STAPLER VISISTAT 35W (STAPLE) ×4 IMPLANT
SURGIFLO W/THROMBIN 8M KIT (HEMOSTASIS) ×4 IMPLANT
SUT BONE WAX W31G (SUTURE) IMPLANT
SUT MNCRL AB 3-0 PS2 27 (SUTURE) ×8 IMPLANT
SUT PDS AB 1 CTX 36 (SUTURE) ×4 IMPLANT
SUT PROLENE 4 0 RB 1 (SUTURE)
SUT PROLENE 4-0 RB1 .5 CRCL 36 (SUTURE) IMPLANT
SUT PROLENE 5 0 CC1 (SUTURE) IMPLANT
SUT PROLENE 6 0 C 1 30 (SUTURE) ×4 IMPLANT
SUT PROLENE 6 0 CC (SUTURE) IMPLANT
SUT SILK 0 TIES 10X30 (SUTURE) ×4 IMPLANT
SUT SILK 2 0 TIES 10X30 (SUTURE) ×4 IMPLANT
SUT SILK 2 0SH CR/8 30 (SUTURE) IMPLANT
SUT SILK 3 0 TIES 10X30 (SUTURE) ×4 IMPLANT
SUT SILK 3 0SH CR/8 30 (SUTURE) IMPLANT
SUT VIC AB 1 CT1 18XCR BRD 8 (SUTURE) ×4 IMPLANT
SUT VIC AB 1 CT1 8-18 (SUTURE) ×4
SUT VIC AB 2-0 CT1 18 (SUTURE) ×8 IMPLANT
SYR BULB IRRIG 60ML STRL (SYRINGE) ×4 IMPLANT
TAPE CLOTH 4X10 WHT NS (GAUZE/BANDAGES/DRESSINGS) ×4 IMPLANT
TOWEL GREEN STERILE (TOWEL DISPOSABLE) ×8 IMPLANT
TOWEL GREEN STERILE FF (TOWEL DISPOSABLE) ×4 IMPLANT
TRAY FOLEY MTR SLVR 16FR STAT (SET/KITS/TRAYS/PACK) ×4 IMPLANT
WATER STERILE IRR 1000ML POUR (IV SOLUTION) ×4 IMPLANT

## 2020-05-01 NOTE — Op Note (Addendum)
Operative report  Preoperative diagnosis: Postlaminectomy syndrome with degenerative lumbar disc disease and recurrent radiculopathy L4-5.  Postoperative diagnosis: Same  Operative procedure: Oblique lumbar interbody fusion L4-5. Posterior instrumented spinal fusion L4-5.  Complications: None  Approach surgeon: Dr. Sherren Mocha early  First Assistant: Cleta Alberts, PA  Allograft: vivogen  Implants: Medtronic peek intervertebral spacer.  12 x 20 x 55.  6 degree lordotic.  NuVasive posterior pedicle screw fixation.  Left: 6.5 x 45 mm length pedicle screws.  Right: 6.5 x 40 mm pedicle screws.  Neuro monitoring: No adverse free running EMG activity or SSEP activity.  All 4 pedicle screws were directly stimulated and there was no adverse activity at greater than 40 mA.  Indications: [name] is a very pleasant 54 year old gentleman who has had a previous L4-5 decompression but unfortunately has had ongoing back buttock and recurrent radicular leg pain.  Attempts at conservative management had failed to alleviate his symptoms.  Intradiscal Marcaine injection provided temporary significant improvement confirming that this was the pain generator.  Having attempted and failed conservative management and his quality of life diminishing he elected to move forward with surgery.  All appropriate risks benefits and alternatives were discussed with the patient and consent was obtained.  Operative report: Patient is brought the operating room placed upon the operating room table.  After successful induction of general anesthesia and endotracheal intubation, teds SCDs and Foley were inserted.  The patient was turned into the lateral decubitus position left side up.  Axillary roll was placed and all bony prominences were well-padded.  The patient was secured to the table with tape after confirming proper positioning and visualization of the L4-5 disc space with fluoroscopy.  The abdomen, left flank, and posterior  lumbar spine were prepped and draped in a standard fashion.  A timeout was taken to confirm patient procedure and all other important data.  At this point Dr. Donnetta Hutching then performed a standard retroperitoneal approach to the lumbar spine through the lateral incision.  Please refer to his dictation for specifics on the approach.  Once the retractors were positioned in the psoas was retracted posteriorly and the iliac vessels were protected the L4-5 disc space was marked with a needle and x-ray was taken to confirm that we are in the appropriate level.  Once confirmed an annulotomy was performed with a 10 blade scalpel and then using a combination of pituitary rongeurs curettes and box osteotomes I removed all the disc material.  I was able to visualize the back to the posterior annulus and using a fine curved curette I released the posterior annulus and used a Kerrison rongeur to resect some of the osteophyte posteriorly.  At this point I confirmed that I had removed all the cartilaginous endplate and had bleeding subchondral bone.  I then used an osteotome to release the contralateral annulus to ensure I had parallel endplate distraction.  At this point I then used trial implants and elected to use the size 12-6 degree lordotic implant.  This provided the best overall fit and restoration of intervertebral disc height and indirect foraminal decompression.  The implant was obtained and packed with the allograft and then malleted to the appropriate depth.  Using fluoroscopy I confirmed that the peek intervertebral cage was properly positioned and well seated.  I also directly visualized the cage and placed the remaining part of allograft along the anterior aspect of the cage for a sentinel fusion.  At this point I then irrigated the wound copiously normal saline  and began removing the retractors sequentially to confirm there was no vessel injury.  After final irrigation I did place some FloSeal to aid in overall  hemostasis with all of 3 retractor blades removed I then closed the fascia of the internal oblique with interrupted #1 Vicryl sutures and the external oblique with a running #1 PDS suture.  I then used a 2 layer closure with interrupted 0 Vicryl suture and 2-0 Vicryl sutures and then a 3-0 Monocryl for the skin.  Fluoroscopic views demonstrated satisfactory positioning of the intervertebral cage in both planes.  An AP digital x-ray was taken and read by the radiologist confirming no retained surgical instrumentation in the wound.  At this point I then went to the lumbar spine and then using fluoroscopy identified the L4 pedicle.  I marked out on the skin surface where the L4 and L5 pedicles were located and infiltrated this area with quarter percent Marcaine with epinephrine.  Small incision was made along the lateral aspect of the lumbar spine approximately 2 fingerbreadths from midline.  A Jamshidi needle was then advanced percutaneously until it reached the lateral aspect of the L4 pedicle using fluoroscopy as well as real-time neuro monitoring I advanced the Jamshidi needle into the L4 pedicle.  As I was nearing the medial wall of the pedicle I then switched to the lateral view to confirm that I was just beyond the posterior wall the vertebral body in the lateral view.  This confirmed satisfactory trajectory and positioning of the Jamshidi needle. I then advanced the Jamshidi needle into the vertebral body and then placed the guidepin to cannulate the pedicle.  I used this exact same technique to cannulate the L5 pedicle and to cannulate the contralateral L4 and L5 pedicles.  Once all 4 pedicles were cannulated I then measured and obtained the appropriate size pedicle screw.  The screw was placed over the guidepin and then advanced over the guidepin into the pedicle.  The screw was directly stimulated while inserting it and there was no adverse activity on the neuro monitoring.  Once all 4 pedicle screws were  properly positioned and the guidepins removed I then stimulated each screw.  There was no adverse activity at greater than 40 mA.  At this point I then measured and obtained the appropriate size rods.  This were placed and then secured with the locking caps.  All 4 locking were tightened and torqued according manufacture standards.  The inserting tabs were removed and final intraoperative fluoroscopy views were taken.  The pedicle screws and intervertebral cage were properly positioned and there were no hardware complications.  The posterior wounds were then irrigated with normal saline and hemostasis is obtained using bipolar cautery.  I then closed in a layered fashion with interrupted #1 Vicryl suture, 2-0 Vicryl suture, and 3-0 Monocryl.  Steri-Strips and a dry dressing were applied and the patient was ultimately extubated transfer the PACU without incident.  The end of the case all needle and sponge counts were correct.  There were no adverse intraoperative events.

## 2020-05-01 NOTE — Anesthesia Procedure Notes (Signed)
Anesthesia Regional Block: TAP block   Pre-Anesthetic Checklist: ,, timeout performed, Correct Patient, Correct Site, Correct Laterality, Correct Procedure, Correct Position, site marked, Risks and benefits discussed, pre-op evaluation,  At surgeon's request and post-op pain management  Laterality: Left  Prep: Maximum Sterile Barrier Precautions used, chloraprep       Needles:  Injection technique: Single-shot  Needle Type: Echogenic Stimulator Needle     Needle Length: 9cm  Needle Gauge: 21     Additional Needles:   Narrative:  Start time: 05/01/2020 7:57 AM End time: 05/01/2020 7:59 AM Injection made incrementally with aspirations every 5 mL. Anesthesiologist: Brennan Bailey, MD  Additional Notes: Risks, benefits, and alternative discussed. Patient gave consent for procedure. Patient prepped and draped in sterile fashion. Sedation administered, patient remains easily responsive to voice. Relevant anatomy identified with ultrasound guidance. Local anesthetic given in 5cc increments with no signs or symptoms of intravascular injection. No pain or paraesthesias with injection. Patient monitored throughout procedure with signs of LAST or immediate complications. Tolerated well. Ultrasound image placed in chart. Tawny Asal, MD

## 2020-05-01 NOTE — Anesthesia Procedure Notes (Signed)
Arterial Line Insertion Start/End6/07/2020 8:10 AM, 05/01/2020 8:15 AM Performed by: Verdie Drown, CRNA  Patient location: Pre-op. Preanesthetic checklist: patient identified, IV checked, site marked, risks and benefits discussed, surgical consent, monitors and equipment checked, pre-op evaluation and timeout performed Lidocaine 1% used for infiltration and patient sedated Left, radial was placed Catheter size: 20 G Hand hygiene performed , maximum sterile barriers used  and Seldinger technique used Allen's test indicative of satisfactory collateral circulation Attempts: 1 Procedure performed without using ultrasound guided technique. Following insertion, dressing applied and Biopatch. Post procedure assessment: normal  Patient tolerated the procedure well with no immediate complications.

## 2020-05-01 NOTE — Anesthesia Postprocedure Evaluation (Signed)
Anesthesia Post Note  Patient: Lyden Redner  Procedure(s) Performed: OBLIQUE LUMBAR INTERBODY FUSION (OLIF) LUMBAR FOUR- FIVE WITH POSTERIOR SPINAL FUSION INTERBODY (N/A Back) ABDOMINAL EXPOSURE (N/A Abdomen)     Patient location during evaluation: PACU Anesthesia Type: General Level of consciousness: awake and alert and oriented Pain management: pain level controlled Vital Signs Assessment: post-procedure vital signs reviewed and stable Respiratory status: spontaneous breathing, nonlabored ventilation and respiratory function stable Cardiovascular status: blood pressure returned to baseline Postop Assessment: no apparent nausea or vomiting Anesthetic complications: no    Last Vitals:  Vitals:   05/01/20 1453 05/01/20 1508  BP: (!) 154/83 (!) 156/79  Pulse: 71 77  Resp: 17 17  Temp:  36.7 C  SpO2: 99% 97%    Last Pain:  Vitals:   05/01/20 1508  TempSrc:   PainSc: [place]

## 2020-05-01 NOTE — Brief Op Note (Signed)
05/01/2020  12:59 PM  PATIENT:  [name]  54 y.o. male  PRE-OPERATIVE DIAGNOSIS:  L4-5 fusion with post laminectomy syndrome  POST-OPERATIVE DIAGNOSIS:  L4-5 fusion with post laminectomy syndrome  PROCEDURE:  Procedure(s) with comments: OBLIQUE LUMBAR INTERBODY FUSION (OLIF) LUMBAR FOUR- FIVE WITH POSTERIOR SPINAL FUSION INTERBODY (N/A) - 5 hrs for both procedures Left sided tap block with exparel Dr. Donnetta Hutching to do approach ABDOMINAL EXPOSURE (N/A)  SURGEON:  Surgeon(s) and Role: Panel 1:    Melina Schools, MD - Primary Panel 2:    * Early, Arvilla Meres, MD - Approach Surgeon   PHYSICIAN ASSISTANT:   ASSISTANTS: Amanda Ward, PA   ANESTHESIA:   general  EBL:  150 mL   BLOOD ADMINISTERED:none  DRAINS: none   LOCAL MEDICATIONS USED:  MARCAINE     SPECIMEN:  No Specimen  DISPOSITION OF SPECIMEN:  N/A  COUNTS:  YES  TOURNIQUET:  * No tourniquets in log *  DICTATION: .Dragon Dictation  PLAN OF CARE: Admit to inpatient   PATIENT DISPOSITION:  PACU - hemodynamically stable.

## 2020-05-01 NOTE — Discharge Instructions (Signed)

## 2020-05-01 NOTE — Anesthesia Procedure Notes (Signed)
Procedure Name: Intubation Date/Time: 05/01/2020 8:43 AM Performed by: Michele Rockers, CRNA Pre-anesthesia Checklist: Patient identified, Patient being monitored, Emergency Drugs available and Suction available Patient Re-evaluated:Patient Re-evaluated prior to induction Oxygen Delivery Method: Circle System Utilized Preoxygenation: Pre-oxygenation with 100% oxygen Induction Type: IV induction Ventilation: Oral airway inserted - appropriate to patient size and Two handed mask ventilation required Laryngoscope Size: Miller and 3 Grade View: Grade I Tube type: Oral Tube size: 8.0 mm Number of attempts: 1 Airway Equipment and Method: Stylet Placement Confirmation: ETT inserted through vocal cords under direct vision,  positive ETCO2 and breath sounds checked- equal and bilateral Secured at: 22 cm Tube secured with: Tape Dental Injury: Teeth and Oropharynx as per pre-operative assessment

## 2020-05-01 NOTE — Op Note (Signed)
° ° °  OPERATIVE REPORT  DATE OF SURGERY: 05/01/2020  PATIENT: [name], 54 y.o. male MRN: [id number]  DOB: Oct 15, 1966  PRE-OPERATIVE DIAGNOSIS: Degenerative disc disease  POST-OPERATIVE DIAGNOSIS:  Same  PROCEDURE: Oblique exposure for L4-5 disc fusion with Dr. Rolena Infante  SURGEON:  Curt Jews, M.D.  Co-surgeon for the exposure Dr. Melina Schools  ANESTHESIA: General  EBL: per anesthesia record  Total I/O In: 1970 [P.O.:120; I.V.:1600; Other:50; IV Piggyback:200] Out: 450 [Urine:300; Blood:150]  BLOOD ADMINISTERED: none  DRAINS: none  SPECIMEN: none  COUNTS CORRECT:  YES  PATIENT DISPOSITION:  PACU - hemodynamically stable  PROCEDURE DETAILS: Patient was taken operating placed supine position where general tracheal intubation was obtained.  The patient was then placed in the right-side-down left side up lateral position.  C-arm was brought into the field to identify the level of the L4-5 disc.  The abdomen and back were prepped and draped in usual sterile fashion.  An oblique incision was made over the area of the 4 5 disc medial to the anterior superior iliac spine.  Incision was continued through the anterior fascia.  The muscle layers of cells were then divided with Metzenbaum scissors bluntly and the retroperitoneal space was entered.  Blunt dissection was continued in the retroperitoneum to allow the intraperitoneal contents to mobilize to the right.  The psoas muscle was palpated and dissection was continued anterior to the psoas muscle.  The iliac vessels were mobilized to the right.  The Thompson retractor was brought onto the field and the 140 blades were positioned superiorly to retract the psoas and the wide 140 blade was positioned inferiorly to position and retract the iliac vessels.  A spinal needle was placed in the 4 5 disc and C-arm was brought back into the field to confirm this was appropriate level.  The third blade was positioned to allow superior  retraction of intraperitoneal contents.  The discectomy and fusion will be dictated as a separate note by Dr. Lynnea Ferrier, M.D., [facility] 05/01/2020 2:12 PM

## 2020-05-01 NOTE — OR Nursing (Signed)
PER PROTOCOL DR. Corene Cornea POFF CALLED AT 11:38 A.M. ON 05/01/2020 , AND CONFIRMED THERE WAS NOT ANY INSTRUMENTATION/SUPPLIES LEFT IN PATIENT.

## 2020-05-01 NOTE — H&P (Signed)
Addendum H&P  Patient continues to have significant back buttock and radicular leg pain right side worse than the left.  Overall quality of life remains poor.  We have gone over the surgical plan which is an oblique lumbar interbody fusion at L4-5 with supplemental posterior fixation.  Patient has expressed an understanding of the procedure as well as the risks and benefits and has expressed a desire to move forward with surgery.  There is been no change in his clinical exam since his last office visit of 04/19/2020.

## 2020-05-01 NOTE — Transfer of Care (Signed)
Immediate Anesthesia Transfer of Care Note  Patient: [name]  Procedure(s) Performed: OBLIQUE LUMBAR INTERBODY FUSION (OLIF) LUMBAR FOUR- FIVE WITH POSTERIOR SPINAL FUSION INTERBODY (N/A Back) ABDOMINAL EXPOSURE (N/A Abdomen)  Patient Location: PACU  Anesthesia Type:General  Level of Consciousness: drowsy, patient cooperative and responds to stimulation  Airway & Oxygen Therapy: Patient Spontanous Breathing and Patient connected to face mask oxygen  Post-op Assessment: Report given to RN, Post -op Vital signs reviewed and stable and Patient moving all extremities  Post vital signs: Reviewed and stable  Last Vitals:  Vitals Value Taken Time  BP 138/79 05/01/20 1343  Temp    Pulse 86 05/01/20 1343  Resp 17 05/01/20 1343  SpO2 96 % 05/01/20 1343  Vitals shown include unvalidated device data.  Last Pain:  Vitals:   05/01/20 0724  TempSrc:   PainSc: 8          Complications: No apparent anesthesia complications

## 2020-05-02 ENCOUNTER — Encounter (HOSPITAL_COMMUNITY): Payer: No Typology Code available for payment source

## 2020-05-02 ENCOUNTER — Inpatient Hospital Stay (HOSPITAL_COMMUNITY): Payer: No Typology Code available for payment source

## 2020-05-02 DIAGNOSIS — Z01810 Encounter for preprocedural cardiovascular examination: Secondary | ICD-10-CM

## 2020-05-02 MED ORDER — RIVAROXABAN 15 MG PO TABS
15.0000 mg | ORAL_TABLET | Freq: Two times a day (BID) | ORAL | 0 refills | Status: DC
Start: 2020-05-02 — End: 2020-12-23

## 2020-05-02 MED ORDER — RIVAROXABAN 15 MG PO TABS
15.0000 mg | ORAL_TABLET | Freq: Every day | ORAL | Status: AC
  Administered 2020-05-02: 15 mg via ORAL
  Filled 2020-05-02 (×3): qty 1

## 2020-05-02 MED ORDER — RIVAROXABAN (XARELTO) EDUCATION KIT FOR DVT/PE PATIENTS
PACK | Freq: Once | Status: DC
  Filled 2020-05-02: qty 1

## 2020-05-02 MED FILL — Thrombin (Recombinant) For Soln 20000 Unit: CUTANEOUS | Qty: 1 | Status: AC

## 2020-05-02 MED FILL — Sodium Chloride IV Soln 0.9%: INTRAVENOUS | Qty: 1000 | Status: AC

## 2020-05-02 MED FILL — Heparin Sodium (Porcine) Inj 1000 Unit/ML: INTRAMUSCULAR | Qty: 1 | Status: AC

## 2020-05-02 NOTE — Progress Notes (Signed)
Patient is discharged from room 3C07 at this time. Alert and in stable condition. First dose of Xarelto given and monitored for an hour without any bleeding noted. IV site d/c'd and instructions read to patient and spouse with understanding verbalized and all questions answered. Education done for new med Xarelto. Left unit via wheelchair with all belongings at side.

## 2020-05-02 NOTE — Evaluation (Signed)
Physical Therapy Evaluation and Discharge Patient Details Name: [name] MRN: [id number] DOB: [birth year] Today's Date: 05/02/2020   History of Present Illness  Pt is a 54 y/o male who presents s/p L4-L5 oblique lumbar interbody fusion (abdominal exposure) on 05/01/2020. PMH significant for prior cervical and lumbar spine surgeries, L hearing loss, HTN, B knee scopes.  Clinical Impression  Patient evaluated by Physical Therapy with no further acute PT needs identified. All education has been completed and the patient has no further questions. Pt was able to demonstrate transfers and ambulation with gross modified independence and no AD. Pt's wife present throughout session and supportive. As pt has had several prior spinal surgeries, he is familiar with precautions and expectations post-op. We reviewed precautions, brace application/wearing schedule, appropriate activity progression, and car transfer. See below for any follow-up Physical Therapy or equipment needs. PT is signing off. Thank you for this referral.     Follow Up Recommendations No PT follow up;Supervision - Intermittent    Equipment Recommendations  None recommended by PT    Recommendations for Other Services       Precautions / Restrictions Precautions Precautions: Back Precaution Booklet Issued: Yes (comment) Precaution Comments: Reviewed handout in detail and pt was cued for precautions during functional mobility.  Required Braces or Orthoses: Spinal Brace Spinal Brace: Lumbar corset;Applied in sitting position Restrictions Weight Bearing Restrictions: No      Mobility  Bed Mobility Overal bed mobility: Modified Independent             General bed mobility comments: Pt initiated log roll and completed without difficulty.   Transfers Overall transfer level: Modified independent Equipment used: None Transfers: Sit to/from Stand           General transfer comment: No assist to power up to full  stand. Pt able to stand with minimal UE support.   Ambulation/Gait Ambulation/Gait assistance: Modified independent (Device/Increase time) Gait Distance (Feet): 400 Feet Assistive device: None Gait Pattern/deviations: Step-through pattern;Decreased stride length;Trunk flexed Gait velocity: Decreased Gait velocity interpretation: 1.31 - 2.62 ft/sec, indicative of limited community ambulator General Gait Details: Pt ambulating well with smooth, casual gait and no unsteadiness or LOB noted.   Stairs            Wheelchair Mobility    Modified Rankin (Stroke Patients Only)       Balance Overall balance assessment: No apparent balance deficits (not formally assessed)                                           Pertinent Vitals/Pain Pain Assessment: 0-10 Pain Score: 7  Pain Location: Back Pain Descriptors / Indicators: Operative site guarding;Grimacing Pain Intervention(s): Limited activity within patient's tolerance;Monitored during session;Repositioned    Home Living Family/patient expects to be discharged to:: Private residence Living Arrangements: Spouse/significant other Available Help at Discharge: Family;Available 24 hours/day (First few days) Type of Home: House Home Access: Stairs to enter   [employer] of Steps: 1 Home Layout: Two level;Able to live on main level with bedroom/bathroom Home Equipment: Gilford Rile - 2 wheels;Bedside commode;Shower seat;Hand held shower head      Prior Function Level of Independence: Independent               Hand Dominance        Extremity/Trunk Assessment   Upper Extremity Assessment Upper Extremity Assessment: Defer to OT evaluation  Lower Extremity Assessment Lower Extremity Assessment: Generalized weakness (Consistent with pre-op diagnosis (R weaker than L))    Cervical / Trunk Assessment Cervical / Trunk Assessment: Other exceptions Cervical / Trunk Exceptions: s/p surgery   Communication   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 - prefers therapist stands on R side)  Cognition Arousal/Alertness: Awake/alert Behavior During Therapy: WFL for tasks assessed/performed Overall Cognitive Status: Within Functional Limits for tasks assessed                                        General Comments      Exercises     Assessment/Plan    PT Assessment Patent does not need any further PT services  PT Problem List Decreased strength;Decreased activity tolerance;Decreased balance;Decreased mobility;Decreased knowledge of use of DME;Decreased safety awareness;Decreased knowledge of precautions;Pain       PT Treatment Interventions      PT Goals (Current goals can be found in the Care Plan section)  Acute Rehab PT Goals Patient Stated Goal: Home today PT Goal Formulation: All assessment and education complete, DC therapy    Frequency     Barriers to discharge        Co-evaluation               AM-PAC PT "6 Clicks" Mobility  Outcome Measure Help needed turning from your back to your side while in a flat bed without using bedrails?: None Help needed moving from lying on your back to sitting on the side of a flat bed without using bedrails?: None Help needed moving to and from a bed to a chair (including a wheelchair)?: None Help needed standing up from a chair using your arms (e.g., wheelchair or bedside chair)?: None Help needed to walk in hospital room?: None Help needed climbing 3-5 steps with a railing? : None 6 Click Score: 24    End of Session Equipment Utilized During Treatment: Back brace Activity Tolerance: Patient tolerated treatment well Patient left: with call bell/phone within reach;with family/visitor present (Sitting EOB awaiting OT) Nurse Communication: Mobility status PT Visit Diagnosis: Unsteadiness on feet (R26.81);Pain Pain - Right/Left: Right Pain - part of body:  (abdomen/low back)    Time: 4034-7425 PT Time  Calculation (min) (ACUTE ONLY): 22 min   Charges:   PT Evaluation $PT Eval Low Complexity: 1 Low          Rolinda Roan, PT, DPT Acute Rehabilitation Services Pager: [phone] Office: [phone]   Thelma Comp 05/02/2020, 8:48 AM

## 2020-05-02 NOTE — Progress Notes (Addendum)
Bilateral lower extremity venous duplex completed. Refer to "CV Proc" under chart review to view preliminary results.  Preliminary results discussed with Randolm Idol.  05/02/2020 11:38 AM Kelby Aline., MHA, RVT, RDCS, RDMS

## 2020-05-02 NOTE — Progress Notes (Signed)
    Subjective: Procedure(s) (LRB): OBLIQUE LUMBAR INTERBODY FUSION (OLIF) LUMBAR FOUR- FIVE WITH POSTERIOR SPINAL FUSION INTERBODY (N/A) ABDOMINAL EXPOSURE (N/A) 1 Day Post-Op  Patient reports pain as 2 on 0-10 scale.  Reports decreased leg pain denies incisional back pain   Positive void Negative bowel movement Positive flatus Negative chest pain or shortness of breath  Objective: Vital signs in last 24 hours: Temp:  [97.7 F (36.5 C)-98.3 F (36.8 C)] 98.2 F (36.8 C) (06/10 0336) Pulse Rate:  [63-85] 63 (06/10 0336) Resp:  [17-20] 20 (06/10 0336) BP: (125-156)/(76-86) 139/76 (06/10 0336) SpO2:  [95 %-100 %] 100 % (06/10 0336) Arterial Line BP: (141-152)/(64-89) 152/89 (06/09 1408)  Intake/Output from previous day: 06/09 0701 - 06/10 0700 In: 2710 [P.O.:360; I.V.:1600; IV Piggyback:200] Out: 450 [Urine:300; Blood:150]  Labs: No results for input(s): WBC, RBC, HCT, PLT in the last 72 hours. No results for input(s): NA, K, CL, CO2, BUN, CREATININE, GLUCOSE, CALCIUM in the last 72 hours. No results for input(s): LABPT, INR in the last 72 hours.  Physical Exam: Neurologically intact ABD soft Intact pulses distally Incision: dressing C/D/I and no drainage Compartment soft Body mass index is 30.42 kg/m.   Assessment/Plan: Patient stable  xrays n/a Continue mobilization with physical therapy Continue care  Advance diet Up with therapy  1.  We will obtain lower extremity Doppler this morning to ensure he does not have a DVT. 2.  Overall he is doing exceptionally well status post anterior posterior spinal fusion. 3.  Plan on discharge to home this afternoon provided his Doppler is negative and he is ambulating with minimal assistance. 4.  Patient will follow-up with me in 2 weeks for wound evaluation.  Melina Schools, MD [facility] [phone]

## 2020-05-02 NOTE — Evaluation (Addendum)
Occupational Therapy Evaluation Patient Details Name: [name] MRN: [id number] DOB: 1966/01/11 Today's Date: 05/02/2020    History of Present Illness Pt is a 54 y/o male who presents s/p L4-L5 oblique lumbar interbody fusion (abdominal exposure) on 05/01/2020. PMH significant for prior cervical and lumbar spine surgeries, L hearing loss, HTN, B knee scopes.   Clinical Impression   Pt evaluated by Occupational Therapy with no further acute OT needs identified. Pt educated on precautions and brace management with good recall and demonstration of functional activities without breaking precautions. Requires supervision for ADLs and ambulation. Pt educated on LB dressing with AE (reacher and sock aid) return demonstration of doff/donn sock with min cues. Pt reported he has access to both upon dc home. Pt will require initial supervision upon dc to ensure safety and adherence to back precautions. OT to sign off. Thank you for this referral.       Follow Up Recommendations  No OT follow up;Supervision - Intermittent    Equipment Recommendations  Other (comment) (sock aid and reacher)    Recommendations for Other Services       Precautions / Restrictions Precautions Precautions: Back;Fall Precaution Booklet Issued: Yes (comment) Precaution Comments:  (Reviewed BLT and handout) Required Braces or Orthoses: Spinal Brace Spinal Brace: Lumbar corset;Applied in sitting position Restrictions Weight Bearing Restrictions: No      Mobility Bed Mobility Overal bed mobility: Modified Independent             General bed mobility comments: Pt initiated proper log roll technique from supine<>sitting EOB  Transfers Overall transfer level: Needs assistance Equipment used: None Transfers: Sit to/from Stand Sit to Stand: Supervision         General transfer comment: Supervision to ensure no bending when powering up to stand    Balance Overall balance assessment: No apparent  balance deficits (not formally assessed)                                         ADL either performed or assessed with clinical judgement   ADL Overall ADL's : Needs assistance/impaired Eating/Feeding: Independent   Grooming: Supervision/safety;Standing Grooming Details (indicate cue type and reason): Supervision to adhere to precautions Upper Body Bathing: Standing;Supervision/ safety Upper Body Bathing Details (indicate cue type and reason): Supervision to adhere to precautions Lower Body Bathing: Supervison/ safety;Cueing for back precautions;Sitting/lateral leans Lower Body Bathing Details (indicate cue type and reason): Supervision to adhere to precautions Upper Body Dressing : Supervision/safety;Standing Upper Body Dressing Details (indicate cue type and reason): initial supervision to adhere to precautions Lower Body Dressing: Cueing for compensatory techniques;With adaptive equipment;Min guard;Sit to/from stand Lower Body Dressing Details (indicate cue type and reason): min guard to adhere to precautions. min cues for utilizing AE Toilet Transfer: Min guard;Ambulation Toilet Transfer Details (indicate cue type and reason): min guard for safety Toileting- Clothing Manipulation and Hygiene: Sit to/from stand;Cueing for back precautions;Supervision/safety Toileting - Clothing Manipulation Details (indicate cue type and reason): supervision for safety and adhere to back precautions Tub/ Shower Transfer: Walk-in shower;Ambulation;Supervision/safety Tub/Shower Transfer Details (indicate cue type and reason): initial supervision for safety Functional mobility during ADLs: Supervision/safety;Cueing for safety General ADL Comments: Pt requires initial supervision to min guard with ADL's. Good safety awareness and knowledge of precautions. May need min cues for reinforcement of back precautions during functional activities. Education on AE for LB dressing. Pt demonstrated  good understanding.  Vision Baseline Vision/History: No visual deficits Patient Visual Report: No change from baseline Vision Assessment?: No apparent visual deficits     Perception     Praxis      Pertinent Vitals/Pain Pain Assessment: Faces Pain Score: 7  Faces Pain Scale: Hurts little more Pain Location: Back Pain Descriptors / Indicators: Operative site guarding;Grimacing Pain Intervention(s): Monitored during session;Limited activity within patient's tolerance     Hand Dominance Right   Extremity/Trunk Assessment Upper Extremity Assessment Upper Extremity Assessment: Overall WFL for tasks assessed   Lower Extremity Assessment Lower Extremity Assessment: Defer to PT evaluation   Cervical / Trunk Assessment Cervical / Trunk Assessment: Other exceptions Cervical / Trunk Exceptions: s/p surgery   Communication Communication Communication: HOH;Other (comment) (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 When reviewing precautions.     Exercises     Shoulder Instructions      Home Living Family/patient expects to be discharged to:: Private residence Living Arrangements: Spouse/significant other Available Help at Discharge: Family;Available 24 hours/day Type of Home: House Home Access: Stairs to enter [employer] of Steps: 1   Home Layout: Two level;Able to live on main level with bedroom/bathroom     Bathroom Shower/Tub: Walk-in shower   Bathroom Toilet: Handicapped height     Home Equipment: Environmental consultant - 2 wheels;Bedside  commode;Shower seat;Hand held shower head          Prior Functioning/Environment Level of Independence: Independent                 OT Problem List: Pain;Decreased knowledge of use of DME or AE;Decreased activity tolerance      OT Treatment/Interventions:      OT Goals(Current goals can be found in the care plan section) Acute Rehab OT Goals Patient Stated Goal: Home today OT Goal Formulation: With patient Time For Goal Achievement: 05/16/20 Potential to Achieve Goals: Good  OT Frequency:     Barriers to D/C:            Co-evaluation              AM-PAC OT "6 Clicks" Daily Activity     Outcome Measure Help from another person eating meals?: None Help from another person taking care of personal grooming?: None Help from another person toileting, which includes using toliet, bedpan, or urinal?: A Little Help from another person bathing (including washing, rinsing, drying)?: A Little Help from another person to put on and taking off regular upper body clothing?: None Help from another person to put on and taking off regular lower body clothing?: A Little 6 Click Score: 21   End of Session Equipment Utilized During Treatment: Gait belt;Other (comment) (hip kit) Nurse Communication: Mobility status  Activity Tolerance: Patient tolerated treatment well Patient left: in bed;with call bell/phone within reach  OT Visit Diagnosis: Pain;Other (comment) (s/p lumbar back surgery ) Pain - part of body:  (back and neck)  Time: 4920-1007 OT Time Calculation (min): 19 min Charges:     Aldwin Micalizzi/OTS  Xochil Shanker 05/02/2020, 9:31 AM

## 2020-05-03 ENCOUNTER — Encounter (HOSPITAL_COMMUNITY): Payer: Self-pay | Admitting: Orthopedic Surgery

## 2020-05-06 NOTE — Discharge Summary (Signed)
Patient ID: [name] MRN: [id number] DOB/AGE: September 12, 1966 54 y.o.  Admit date: 05/01/2020 Discharge date: 05/06/2020  Admission Diagnoses:  Active Problems:   Fusion of lumbar spine   Discharge Diagnoses:  Active Problems:   Fusion of lumbar spine  status post Procedure(s): OBLIQUE LUMBAR INTERBODY FUSION (OLIF) LUMBAR FOUR- FIVE WITH POSTERIOR SPINAL FUSION INTERBODY ABDOMINAL EXPOSURE  Past Medical History:  Diagnosis Date  . Anxiety    ptsd  . Arthritis   . Depression   . Family history of adverse reaction to anesthesia    Grandfather "woke up confused"?   . Headache   . Heart murmur    "years ago" ; denies symptom   . Hypertension   . Left ear hearing loss   . Low back pain   . PONV (postoperative nausea and vomiting)   . Radicular leg pain    Intermittent Right leg pain  . Sleep apnea     Surgeries: Procedure(s): OBLIQUE LUMBAR INTERBODY FUSION (OLIF) LUMBAR FOUR- FIVE WITH POSTERIOR SPINAL FUSION INTERBODY ABDOMINAL EXPOSURE on 05/01/2020   Consultants: Treatment Team:  Rosetta Posner, MD  Discharged Condition: Improved  Hospital Course: [name] is an 54 y.o. male who was admitted 05/01/2020 for operative treatment of Postlaminectomy syndrome with degenerative lumbar disc disease and recurrent radiculopathy L4-5.Marland Kitchen Patient failed conservative treatments (please see the history and physical for the specifics) and had severe unremitting pain that affects sleep, daily activities and work/hobbies. After pre-op clearance, the patient was taken to the operating room on 05/01/2020 and underwent  Procedure(s): OBLIQUE LUMBAR INTERBODY FUSION (OLIF) LUMBAR FOUR- FIVE WITH POSTERIOR SPINAL FUSION INTERBODY ABDOMINAL EXPOSURE.    Patient was given perioperative antibiotics:  Anti-infectives (From admission, onward)   Start     Dose/Rate Route Frequency Ordered Stop   05/01/20 2200  ceFAZolin (ANCEF) IVPB 1 g/50 mL premix        1 g 100 mL/hr over  30 Minutes Intravenous Every 8 hours 05/01/20 1522 05/02/20 0928   05/01/20 0711  ceFAZolin (ANCEF) IVPB 2g/100 mL premix        2 g 200 mL/hr over 30 Minutes Intravenous 30 min pre-op 05/01/20 0711 05/01/20 1300       Patient was given sequential compression devices and early ambulation to prevent DVT.   Patient benefited maximally from hospital stay and there were no complications. At the time of discharge, the patient was urinating/moving their bowels without difficulty, tolerating a regular diet, pain is controlled with oral pain medications and they have been cleared by PT/OT.   Recent vital signs: No data found.   Recent laboratory studies: No results for input(s): WBC, HGB, HCT, PLT, NA, K, CL, CO2, BUN, CREATININE, GLUCOSE, INR, CALCIUM in the last 72 hours.  Invalid input(s): PT, 2   Discharge Medications:   Allergies as of 05/02/2020      Reactions   Lisinopril Swelling, Rash, Anaphylaxis   Tramadol Other (See Comments)   "complete blackout;" can't remember a thing Extremely Violent, Blackout   Gabapentin Itching      Medication List    STOP taking these medications   acetaminophen 500 MG tablet Commonly known as: TYLENOL   azelastine 0.1 % nasal spray Commonly known as: ASTELIN   diclofenac Sodium 1 % Gel Commonly known as: VOLTAREN   fexofenadine 180 MG tablet Commonly known as: ALLEGRA   HYDROcodone-acetaminophen 5-325 MG tablet Commonly known as: NORCO/VICODIN   MUSCLE RUB EX     TAKE these medications  chlorthalidone 25 MG tablet Commonly known as: HYGROTON Take 12.5 mg by mouth daily.   methocarbamol 500 MG tablet Commonly known as: Robaxin Take 1 tablet (500 mg total) by mouth every 8 (eight) hours as needed for up to 5 days for muscle spasms. What changed: when to take this   ondansetron 4 MG tablet Commonly known as: Zofran Take 1 tablet (4 mg total) by mouth every 8 (eight) hours as needed for nausea or vomiting.     oxyCODONE-acetaminophen 10-325 MG tablet Commonly known as: Percocet Take 1 tablet by mouth every 6 (six) hours as needed for up to 5 days for pain.   Rivaroxaban 15 MG Tabs tablet Commonly known as: XARELTO Take 1 tablet (15 mg total) by mouth 2 (two) times daily with a meal.       Diagnostic Studies: DG Chest 2 View  Result Date: 04/25/2020 CLINICAL DATA:  Preop evaluation for upcoming lumbar surgery EXAM: CHEST - 2 VIEW COMPARISON:  None. FINDINGS: Cardiac shadow is within normal limits. Mild aortic calcifications are seen. The lungs are clear. No bony abnormality is noted. Postsurgical changes in the cervical spine are seen. IMPRESSION: No acute abnormality noted. Electronically Signed   By: Inez Catalina M.D.   On: 04/25/2020 20:26   DG Lumbar Spine 2-3 Views  Result Date: 05/01/2020 CLINICAL DATA:  L4-5 fusion EXAM: LUMBAR SPINE - 2-3 VIEW; DG C-ARM 1-60 MIN COMPARISON:  07/14/2017 FINDINGS: Two fluoroscopic images are obtained during performance of the procedure and are provided for interpretation only. Images demonstrate L4/L5 discectomy with posterior fusion. Alignment is anatomic. FLUOROSCOPY TIME:  4 minutes 10 seconds IMPRESSION: 1. L4/L5 discectomy and fusion. Electronically Signed   By: Randa Ngo M.D.   On: 05/01/2020 15:18   DG C-Arm 1-60 Min  Result Date: 05/01/2020 CLINICAL DATA:  L4-5 fusion EXAM: LUMBAR SPINE - 2-3 VIEW; DG C-ARM 1-60 MIN COMPARISON:  07/14/2017 FINDINGS: Two fluoroscopic images are obtained during performance of the procedure and are provided for interpretation only. Images demonstrate L4/L5 discectomy with posterior fusion. Alignment is anatomic. FLUOROSCOPY TIME:  4 minutes 10 seconds IMPRESSION: 1. L4/L5 discectomy and fusion. Electronically Signed   By: Randa Ngo M.D.   On: 05/01/2020 15:18   VAS [place] LOWER EXTREMITY VENOUS (DVT)  Result Date: 05/02/2020  Lower Venous DVTStudy Indications: S/p ALIF.  Comparison Study: No prior study Performing  Technologist: Maudry Mayhew MHA, RDMS, RVT, RDCS  Examination Guidelines: A complete evaluation includes B-mode imaging, spectral Doppler, color Doppler, and power Doppler as needed of all accessible portions of each vessel. Bilateral testing is considered an integral part of a complete examination. Limited examinations for reoccurring indications may be performed as noted. The reflux portion of the exam is performed with the patient in reverse Trendelenburg.  +---------+---------------+---------+-----------+----------+--------------+ RIGHT    CompressibilityPhasicitySpontaneityPropertiesThrombus Aging +---------+---------------+---------+-----------+----------+--------------+ CFV      Full           Yes      Yes                                 +---------+---------------+---------+-----------+----------+--------------+ SFJ      Full                                                        +---------+---------------+---------+-----------+----------+--------------+  FV Prox  Full                                                        +---------+---------------+---------+-----------+----------+--------------+ FV Mid   Full                                                        +---------+---------------+---------+-----------+----------+--------------+ FV DistalFull                                                        +---------+---------------+---------+-----------+----------+--------------+ PFV      Full                                                        +---------+---------------+---------+-----------+----------+--------------+ POP      Full           Yes      Yes                                 +---------+---------------+---------+-----------+----------+--------------+ PTV      None                    No                   Acute          +---------+---------------+---------+-----------+----------+--------------+ PERO     Full                                                         +---------+---------------+---------+-----------+----------+--------------+   +---------+---------------+---------+-----------+----------+--------------+ LEFT     CompressibilityPhasicitySpontaneityPropertiesThrombus Aging +---------+---------------+---------+-----------+----------+--------------+ CFV      Full           Yes      Yes                                 +---------+---------------+---------+-----------+----------+--------------+ SFJ      Full                                                        +---------+---------------+---------+-----------+----------+--------------+ FV Prox  Full                                                        +---------+---------------+---------+-----------+----------+--------------+  FV Mid   Full                                                        +---------+---------------+---------+-----------+----------+--------------+ FV DistalFull                                                        +---------+---------------+---------+-----------+----------+--------------+ PFV      Full                                                        +---------+---------------+---------+-----------+----------+--------------+ POP      Full           Yes      Yes                                 +---------+---------------+---------+-----------+----------+--------------+ PTV      Full                                                        +---------+---------------+---------+-----------+----------+--------------+ PERO     Full                                                        +---------+---------------+---------+-----------+----------+--------------+     Summary: RIGHT: - Findings consistent with a small segment of acute deep vein thrombosis involving a single right posterior tibial vein. - No cystic structure found in the popliteal fossa.  LEFT: - There is no evidence of deep vein  thrombosis in the lower extremity.  - No cystic structure found in the popliteal fossa.  *See table(s) above for measurements and observations. Electronically signed by Servando Snare MD on 05/02/2020 at 5:00:14 PM.    Final    DG OR LOCAL ABDOMEN  Result Date: 05/01/2020 CLINICAL DATA:  Final instrumentation count, reportedly a correct count. L4-5 spinal fusion. EXAM: OR LOCAL ABDOMEN COMPARISON:  None. FINDINGS: Multiple surgical staples overlie the bilateral abdomen and bilateral pelvis. Spinal fusion hardware overlies the L4-5 disc level. No unexpected radiopaque foreign bodies. IMPRESSION: No unexpected radiopaque foreign body. These results were called by telephone at the time of interpretation on 05/01/2020 at 11:38 am to Belleair Shore in the OR, who verbally acknowledged these results. Electronically Signed   By: Ilona Sorrel M.D.   On: 05/01/2020 11:40    Discharge Instructions    Incentive spirometry RT   Complete by: As directed        Follow-up Information    Melina Schools, MD. Schedule an appointment as soon as possible for a visit in 2 weeks.   Specialty: Orthopedic Surgery  Why: If symptoms worsen, For suture removal, For wound re-check Contact information: [address] [place] [address] [phone]               Discharge Plan:  discharge to home  Disposition: stable    Signed: Yvonne Kendall Cyani Kallstrom for [facility] PA-C [facility] [phone] 05/06/2020, 2:03 PM

## 2020-05-07 ENCOUNTER — Ambulatory Visit (HOSPITAL_COMMUNITY)
Admission: RE | Admit: 2020-05-07 | Discharge: 2020-05-07 | Disposition: A | Discharge status: Home / Self Care | Payer: No Typology Code available for payment source | Source: Ambulatory Visit | Attending: Vascular Surgery | Admitting: Vascular Surgery

## 2020-05-07 ENCOUNTER — Ambulatory Visit (INDEPENDENT_AMBULATORY_CARE_PROVIDER_SITE_OTHER): Payer: Self-pay | Admitting: Vascular Surgery

## 2020-05-07 ENCOUNTER — Other Ambulatory Visit: Payer: Self-pay

## 2020-05-07 ENCOUNTER — Other Ambulatory Visit: Payer: Self-pay | Admitting: *Deleted

## 2020-05-07 ENCOUNTER — Encounter: Payer: Self-pay | Admitting: Vascular Surgery

## 2020-05-07 VITALS — BP 134/84 | HR 73 | Temp 98.1°F | Resp 20 | Ht 69.0 in | Wt 204.2 lb

## 2020-05-07 DIAGNOSIS — M5136 Other intervertebral disc degeneration, lumbar region: Secondary | ICD-10-CM

## 2020-05-07 DIAGNOSIS — M79604 Pain in right leg: Secondary | ICD-10-CM

## 2020-05-07 NOTE — Progress Notes (Signed)
   [name] MRN: [id number] DOB: 16-Jul-1966 Sex: male  REASON FOR VISIT: Follow-up possible postop DVT.  HPI: [name] is a 54 y.o. male here today for evaluation of possible postop DVT.  He underwent uneventful O LIF with Dr. Rolena Infante on 05/01/2020.  He had no postoperative issues and was discharged home.  Prior to discharge he underwent routine rule out DVT lower extremities.  This study on 05/02/2020 suggested a small segment of acute DVT involving a single right posterior tibial vein.  He is seen today for further discussion.  He does not have any swelling or symptoms of DVT.  Current Outpatient Medications  Medication Sig Dispense Refill  . chlorthalidone (HYGROTON) 25 MG tablet Take 12.5 mg by mouth daily.     . ondansetron (ZOFRAN) 4 MG tablet Take 1 tablet (4 mg total) by mouth every 8 (eight) hours as needed for nausea or vomiting. (Patient not taking: Reported on 05/07/2020) 20 tablet 0  . oxyCODONE-acetaminophen (PERCOCET) 10-325 MG tablet oxycodone-acetaminophen 10 mg-325 mg tablet  Take 1 tablet 3 times a day by oral route as needed for 10 days.    . Rivaroxaban (XARELTO) 15 MG TABS tablet Take 1 tablet (15 mg total) by mouth 2 (two) times daily with a meal. (Patient not taking: Reported on 05/07/2020) 42 tablet 0   No current facility-administered medications for this visit.     PHYSICAL EXAM: Vitals:   05/07/20 1028  BP: 134/84  Pulse: 73  Resp: 20  Temp: 98.1 F (36.7 C)  TempSrc: Temporal  SpO2: 95%  Weight: 204 lb 3.2 oz (92.6 kg)  Height: 5\' 9"  (1.753 m)    GENERAL: The patient is a well-nourished male, in no acute distress. The vital signs are documented above. No swelling in his lower extremities.  Normal dorsalis pedis pulses   He underwent a repeat follow-up of his tibial vein on the right leg.  We were unable to visualize any evidence of right leg DVT.  Specifically did not see any  posterior tibial thrombus.  MEDICAL ISSUES: I discussed this at length with the patient and his wife present.  Certainly be unexpected to see the right leg DVT with a lift.  I did explain the concern of manipulation of the left iliac vein and the concern about possible proximal DVT.  I have obviously recommended that he have no indication for anticoagulation.  He will continue his routine postoperative follow-up with Dr. Rolena Infante.   Rosetta Posner, MD FACS [facility] Tel [phone] Pager [phone]

## 2020-07-26 ENCOUNTER — Other Ambulatory Visit: Payer: Self-pay | Admitting: Orthopedic Surgery

## 2020-07-26 ENCOUNTER — Other Ambulatory Visit (HOSPITAL_COMMUNITY): Payer: Self-pay | Admitting: Orthopedic Surgery

## 2020-07-26 DIAGNOSIS — M542 Cervicalgia: Secondary | ICD-10-CM

## 2020-09-26 ENCOUNTER — Encounter: Payer: Self-pay | Admitting: Neurology

## 2020-10-07 ENCOUNTER — Other Ambulatory Visit: Payer: Self-pay | Admitting: Orthopedic Surgery

## 2020-10-07 DIAGNOSIS — M9904 Segmental and somatic dysfunction of sacral region: Secondary | ICD-10-CM

## 2020-10-09 ENCOUNTER — Other Ambulatory Visit: Payer: Self-pay | Admitting: Orthopedic Surgery

## 2020-12-09 NOTE — Progress Notes (Deleted)
NEUROLOGY CONSULTATION NOTE  Cartrell Bentsen MRN: [id number] DOB: [birth year]  Referring provider: [facility] Primary care provider: ***  Reason for consult:  History of TBI   Subjective:  [name] is a 55 year old ***-handed male with PTSD who presents for history of TBI.  He is accompanied by his *** who supplements history.  He is a [place] War veteran ***.  He has a history of cervical spondylosis and underwent C5-C7 ACDF in ***.  MRI of cervical spine on 12/10/2017 showed C5-C7 ACDF with decreased disc protrusion at C4-5 and no significant residual canal stenosis at C5-6.     PAST MEDICAL HISTORY: Past Medical History:  Diagnosis Date  . Anxiety    ptsd  . Arthritis   . Depression   . Family history of adverse reaction to anesthesia    Grandfather "woke up confused"?   . Headache   . Heart murmur    "years ago" ; denies symptom   . Hypertension   . Left ear hearing loss   . Low back pain   . PONV (postoperative nausea and vomiting)   . Radicular leg pain    Intermittent Right leg pain  . Sleep apnea     PAST SURGICAL HISTORY: Past Surgical History:  Procedure Laterality Date  . ABDOMINAL EXPOSURE N/A 05/01/2020   Procedure: ABDOMINAL EXPOSURE;  Surgeon: Rosetta Posner, MD;  Location: [place];  Service: Vascular;  Laterality: N/A;  . APPENDECTOMY  11/23/2017  . BACK SURGERY    . BILATERAL KNEE ARTHROSCOPY    . c5-6     removal of fusion and spacers  . CERVICAL SPINE SURGERY     2015,OCTOBER  . COLONOSCOPY    . IR FLUORO GUIDED NEEDLE PLC ASPIRATION/INJECTION LOC  12/19/2019  . LUMBAR LAMINECTOMY/DECOMPRESSION MICRODISCECTOMY N/A 07/14/2017   Procedure: CENTRAL MICROLUMBAR DECOMPRESSION OF L3-L4 AND L4-L5,  AND EXCISION OF LIPOMA;  Surgeon: Susa Day, MD;  Location: [facility];  Service: Orthopedics;  Laterality: N/A;  . ORTHOPAEDIC SURGERY  06/15/2019    MEDICATIONS: Current Outpatient Medications on File Prior to Visit   Medication Sig Dispense Refill  . chlorthalidone (HYGROTON) 25 MG tablet Take 12.5 mg by mouth daily.     . ondansetron (ZOFRAN) 4 MG tablet Take 1 tablet (4 mg total) by mouth every 8 (eight) hours as needed for nausea or vomiting. (Patient not taking: Reported on 05/07/2020) 20 tablet 0  . oxyCODONE-acetaminophen (PERCOCET) 10-325 MG tablet oxycodone-acetaminophen 10 mg-325 mg tablet  Take 1 tablet 3 times a day by oral route as needed for 10 days.    . Rivaroxaban (XARELTO) 15 MG TABS tablet Take 1 tablet (15 mg total) by mouth 2 (two) times daily with a meal. (Patient not taking: Reported on 05/07/2020) 42 tablet 0   No current facility-administered medications on file prior to visit.    ALLERGIES: Allergies  Allergen Reactions  . Lisinopril Swelling, Rash and Anaphylaxis  . Tramadol Other (See Comments)    "complete blackout;" can't remember a thing Extremely Violent, Blackout  . Gabapentin Itching    FAMILY HISTORY: Family History  Problem Relation Age of Onset  . Hepatitis Mother   . Colon cancer Neg Hx    ***.  SOCIAL HISTORY: Social History   Socioeconomic History  . Marital status: Married    Spouse name: Not on file  . Number of children: Not on file  . Years of education: Not on file  . Highest  education level: Not on file  Occupational History    Comment: No longer working  Tobacco Use  . Smoking status: Former Smoker    Types: Cigarettes    Quit date: 12/24/2012    Years since quitting: 7.9  . Smokeless tobacco: Never Used  Vaping Use  . Vaping Use: Never used  Substance and Sexual Activity  . Alcohol use: Yes    Alcohol/week: 14.0 standard drinks    Types: 7 Glasses of wine, 7 Cans of beer per week  . Drug use: No  . Sexual activity: Not on file  Other Topics Concern  . Not on file  Social History Narrative  . Not on file   Social Determinants of Health   Financial Resource Strain: Not on file  Food Insecurity: Not on file  Transportation  Needs: Not on file  Physical Activity: Not on file  Stress: Not on file  Social Connections: Not on file  Intimate Partner Violence: Not on file    Objective:  *** General: No acute distress.  Patient appears well-groomed.   Head:  Normocephalic/atraumatic Eyes:  fundi examined but not visualized Neck: supple, no paraspinal tenderness, full range of motion Back: No paraspinal tenderness Heart: regular rate and rhythm Lungs: Clear to auscultation bilaterally. Vascular: No carotid bruits. Neurological Exam: Mental status: alert and oriented to person, place, and time, recent and remote memory intact, fund of knowledge intact, attention and concentration intact, speech fluent and not dysarthric, language intact. Cranial nerves: CN I: not tested CN II: pupils equal, round and reactive to light, visual fields intact CN III, IV, VI:  full range of motion, no nystagmus, no ptosis CN V: facial sensation intact. CN VII: upper and lower face symmetric CN VIII: hearing intact CN IX, X: gag intact, uvula midline CN XI: sternocleidomastoid and trapezius muscles intact CN XII: tongue midline Bulk & Tone: normal, no fasciculations. Motor:  muscle strength 5/5 throughout Sensation:  Pinprick, temperature and vibratory sensation intact. Deep Tendon Reflexes:  2+ throughout,  toes downgoing.   Finger to nose testing:  Without dysmetria.   Heel to shin:  Without dysmetria.   Gait:  Normal station and stride.  Romberg negative.  Assessment/Plan:   ***    Thank you for allowing me to take part in the care of this patient.  Metta Clines, DO  CC: ***

## 2020-12-10 ENCOUNTER — Ambulatory Visit: Payer: No Typology Code available for payment source | Admitting: Neurology

## 2020-12-20 NOTE — Progress Notes (Signed)
NEUROLOGY CONSULTATION NOTE  [name] MRN: [id number] DOB: [birth year]  Referring provider: [facility] Primary care provider: No PCP  Reason for consult:  History of TBI   Subjective:  [name] is a 55 year old male with PTSD who presents for history of TBI.    He is a [place].  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  He also has PTSD.    He reports short term memory deficits.  He way walk into a room and forget what he wanted to do.  He may get into his truck and forget where he wanted to go.    He also has headaches.  They are severe bi-occipital throbbing pain that radiates to the front.  Sometimes associated with blurred vision.  He has a history of cervical spondylosis and underwent C5-C7 ACDF in 2015.  MRI of cervical spine on 12/10/2017 showed C5-C7 ACDF with decreased disc protrusion at C4-5 and no significant residual canal stenosis at C5-6.  Headaches used to be frequent and last days.  Following surgery, they have improved.  They now last an hour (sometimes a couple of days) and occur 1 to 2 times a month.  He treats with ibuprofen or naproxen followed by acetaminophen if needed.  He has other chronic pain including low back (s/p lumbar spinal fusion).       PAST MEDICAL HISTORY: Past Medical History:  Diagnosis Date  . Anxiety    ptsd  . Arthritis   . Depression   . Family history of adverse reaction to anesthesia    Grandfather "woke up confused"?   . Headache   . Heart murmur    "years ago" ; denies symptom   . Hypertension   . Left ear hearing loss   . Low back pain   . PONV (postoperative nausea and vomiting)   . Radicular leg pain    Intermittent Right leg pain  . Sleep apnea     PAST SURGICAL HISTORY: Past Surgical History:  Procedure Laterality Date  . ABDOMINAL EXPOSURE N/A 05/01/2020   Procedure: ABDOMINAL EXPOSURE;   Surgeon: Rosetta Posner, MD;  Location: [place];  Service: Vascular;  Laterality: N/A;  . APPENDECTOMY  11/23/2017  . BACK SURGERY    . BILATERAL KNEE ARTHROSCOPY    . c5-6     removal of fusion and spacers  . CERVICAL SPINE SURGERY     2015,OCTOBER  . COLONOSCOPY    . IR FLUORO GUIDED NEEDLE PLC ASPIRATION/INJECTION LOC  12/19/2019  . LUMBAR LAMINECTOMY/DECOMPRESSION MICRODISCECTOMY N/A 07/14/2017   Procedure: CENTRAL MICROLUMBAR DECOMPRESSION OF L3-L4 AND L4-L5,  AND EXCISION OF LIPOMA;  Surgeon: Susa Day, MD;  Location: [facility];  Service: Orthopedics;  Laterality: N/A;  . ORTHOPAEDIC SURGERY  06/15/2019    MEDICATIONS: Current Outpatient Medications on File Prior to Visit  Medication Sig Dispense Refill  . chlorthalidone (HYGROTON) 25 MG tablet Take 12.5 mg by mouth daily.     . ondansetron (ZOFRAN) 4 MG tablet Take 1 tablet (4 mg total) by mouth every 8 (eight) hours as needed for nausea or vomiting. (Patient not taking: Reported on 05/07/2020) 20 tablet 0  . oxyCODONE-acetaminophen (PERCOCET) 10-325 MG tablet oxycodone-acetaminophen 10 mg-325 mg tablet  Take 1 tablet 3 times a day by oral route as needed for 10 days.    . Rivaroxaban (XARELTO) 15 MG TABS tablet Take 1 tablet (15  mg total) by mouth 2 (two) times daily with a meal. (Patient not taking: Reported on 05/07/2020) 42 tablet 0   No current facility-administered medications on file prior to visit.    ALLERGIES: Allergies  Allergen Reactions  . Lisinopril Swelling, Rash and Anaphylaxis  . Tramadol Other (See Comments)    "complete blackout;" can't remember a thing Extremely Violent, Blackout  . Gabapentin Itching    FAMILY HISTORY: Family History  Problem Relation Age of Onset  . Hepatitis Mother   . Colon cancer Neg Hx     SOCIAL HISTORY: Social History   Socioeconomic History  . Marital status: Married    Spouse name: Not on file  . Number of children: Not on file  . Years of education: Not on file  .  Highest education level: Not on file  Occupational History    Comment: No longer working  Tobacco Use  . Smoking status: Former Smoker    Types: Cigarettes    Quit date: 12/24/2012    Years since quitting: 7.9  . Smokeless tobacco: Never Used  Vaping Use  . Vaping Use: Never used  Substance and Sexual Activity  . Alcohol use: Yes    Alcohol/week: 14.0 standard drinks    Types: 7 Glasses of wine, 7 Cans of beer per week  . Drug use: No  . Sexual activity: Not on file  Other Topics Concern  . Not on file  Social History Narrative  . Not on file   Social Determinants of Health   Financial Resource Strain: Not on file  Food Insecurity: Not on file  Transportation Needs: Not on file  Physical Activity: Not on file  Stress: Not on file  Social Connections: Not on file  Intimate Partner Violence: Not on file    Objective:  Blood pressure (!) 146/78, pulse 71, resp. rate 18, height 5\' 10"  (1.778 m), weight 210 lb (95.3 kg), SpO2 98 %. General: No acute distress.  Patient appears well-groomed.   Head:  Normocephalic/atraumatic Eyes:  fundi examined but not visualized Neck: supple, no paraspinal tenderness, full range of motion Back: No paraspinal tenderness Heart: regular rate and rhythm Lungs: Clear to auscultation bilaterally. Vascular: No carotid bruits. Neurological Exam: Mental status: alert and oriented to person, place, and time, recent and remote memory intact, fund of knowledge intact, attention and concentration intact, speech fluent and not dysarthric, language intact. Cranial nerves: CN I: not tested CN II: pupils equal, round and reactive to light, visual fields intact CN III, IV, VI:  full range of motion, no nystagmus, no ptosis CN V: facial sensation intact. CN VII: upper and lower face symmetric CN VIII: hearing intact CN IX, X: gag intact, uvula midline CN XI: sternocleidomastoid and trapezius muscles intact CN XII: tongue midline Bulk & Tone: normal, no  fasciculations. Motor:  muscle strength 5/5 throughout Sensation:  Temperature sensation reduced in first 2 digits of right hand, otherwise temperature and vibratory sensation intact. Deep Tendon Reflexes:  2+ throughout,  toes downgoing.   Finger to nose testing:  Without dysmetria.   Heel to shin:  Without dysmetria.   Gait:  Normal station and stride.  Romberg negative.  Assessment/Plan:   1.  History of traumatic brain injury with headaches and memory deficits 2.  Cervicogenic headache 3.  PTSD 4.  Memory deficits.  Would like to determine if he exhibits residual symptoms of TBI, or can his symptoms be related to other factors (headaches cervicogenic, memory deficits related to PTSD or  chronic pain, etc)  1.  MRI of brain without contrast 2.  Neuropsychological testing 3.  Follow up after testing.    Thank you for allowing me to take part in the care of this patient.  Metta Clines, DO

## 2020-12-23 ENCOUNTER — Other Ambulatory Visit: Payer: Self-pay

## 2020-12-23 ENCOUNTER — Encounter: Payer: Self-pay | Admitting: Neurology

## 2020-12-23 ENCOUNTER — Ambulatory Visit (INDEPENDENT_AMBULATORY_CARE_PROVIDER_SITE_OTHER): Payer: No Typology Code available for payment source | Admitting: Neurology

## 2020-12-23 VITALS — BP 146/78 | HR 71 | Resp 18 | Ht 70.0 in | Wt 210.0 lb

## 2020-12-23 DIAGNOSIS — S069X1D Unspecified intracranial injury with loss of consciousness of 30 minutes or less, subsequent encounter: Secondary | ICD-10-CM | POA: Diagnosis not present

## 2020-12-23 DIAGNOSIS — G4486 Cervicogenic headache: Secondary | ICD-10-CM | POA: Diagnosis not present

## 2020-12-23 NOTE — Patient Instructions (Addendum)
1.  Will check MRI of brain without contrast. We have sent a referral to [facility] for your MRI and they will call you directly to schedule your appointment. They are located at [place]. If you need to contact them directly please call [phone].  2.  Will order neurocognitive testing 3.  Follow up after testing.

## 2021-01-10 ENCOUNTER — Ambulatory Visit
Admission: RE | Admit: 2021-01-10 | Discharge: 2021-01-10 | Disposition: A | Discharge status: Home / Self Care | Payer: No Typology Code available for payment source | Source: Ambulatory Visit | Attending: Neurology | Admitting: Neurology

## 2021-01-10 DIAGNOSIS — G4486 Cervicogenic headache: Secondary | ICD-10-CM

## 2021-01-10 DIAGNOSIS — S069X1D Unspecified intracranial injury with loss of consciousness of 30 minutes or less, subsequent encounter: Secondary | ICD-10-CM

## 2021-01-22 ENCOUNTER — Ambulatory Visit: Payer: No Typology Code available for payment source | Admitting: Neurology

## 2021-01-30 ENCOUNTER — Encounter: Payer: No Typology Code available for payment source | Admitting: Counselor

## 2021-01-31 ENCOUNTER — Ambulatory Visit (INDEPENDENT_AMBULATORY_CARE_PROVIDER_SITE_OTHER): Payer: No Typology Code available for payment source | Admitting: Counselor

## 2021-01-31 ENCOUNTER — Other Ambulatory Visit: Payer: Self-pay

## 2021-01-31 ENCOUNTER — Encounter: Payer: Self-pay | Admitting: Counselor

## 2021-01-31 ENCOUNTER — Ambulatory Visit: Payer: No Typology Code available for payment source

## 2021-01-31 DIAGNOSIS — G4486 Cervicogenic headache: Secondary | ICD-10-CM

## 2021-01-31 DIAGNOSIS — F4310 Post-traumatic stress disorder, unspecified: Secondary | ICD-10-CM | POA: Diagnosis not present

## 2021-01-31 DIAGNOSIS — F09 Unspecified mental disorder due to known physiological condition: Secondary | ICD-10-CM | POA: Diagnosis not present

## 2021-01-31 DIAGNOSIS — Z87828 Personal history of other (healed) physical injury and trauma: Secondary | ICD-10-CM
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
## 2021-01-31 NOTE — Progress Notes (Signed)
   Psychometrist Note   Cognitive testing was administered to [name] by Lamar Benes, B.S. (Technician) under the supervision of Alphonzo Severance, Psy.D., ABN. [name] was able to tolerate all test procedures. Dr. Nicole Kindred met with the patient as needed to manage any emotional reactions to the testing procedures. Rest breaks were offered.    The battery of tests administered was selected by Dr. Nicole Kindred with consideration to the patient's current level of functioning, the nature of his symptoms, emotional and behavioral responses during the interview, level of literacy, observed level of motivation/effort, and the nature of the referral question. This battery was communicated to the psychometrist. Communication between Dr. Nicole Kindred and the psychometrist was ongoing throughout the evaluation and Dr. Nicole Kindred was immediately accessible at all times. Dr. Nicole Kindred provided supervision to the technician on the date of this service, to the extent necessary to assure the quality of all services provided.    [name] will return in approximately one week for an interactive feedback session with Dr. Nicole Kindred, at which time test performance, clinical impressions, and treatment recommendations will be reviewed in detail. The patient understands he can contact our office should he require our assistance before this time.   A total of 130 minutes of billable time were spent with [name] by the technician, including test administration and scoring time. Billing for these services is reflected in Dr. Les Pou note.   This note reflects time spent with the psychometrician and does not include test scores, clinical history, or any interpretations made by Dr. Nicole Kindred. The full report will follow in a separate note.

## 2021-01-31 NOTE — Progress Notes (Signed)
[facility]  Patient Name: [name] MRN: [id number] Date of Birth: Feb 04, 1966 Age: 55 y.o. Education: 14 years  Measurement properties of test scores: IQ, Index, and Standard Scores (SS): Mean = 100; Standard Deviation = 15 Scaled Scores (Ss): Mean = 10; Standard Deviation = 3 Z scores (Z): Mean = 0; Standard Deviation = 1 T scores (T); Mean = 50; Standard Deviation = 10  [id number]    Note: This summary of test scores accompanies the interpretive report and should not be interpreted by unqualified individuals or in isolation without reference to the report. Test scores are relative to age, gender, and educational history as available and appropriate.   Performance Validity        ACS: Raw Descriptor      Word Choice: 40 Below Expectation      Rey 15 and Recognition: Raw Descriptor      Free Recall 12 Within Expectation      Recognition 12 ---      False Positives 0 ---      Combined Score 24 Within Expectation      MSVT: Raw Descriptor      IR 100 Within Expectation      DR 100 Within Expectation      CNS 100 Within Expectation      PA 100 ---      FR 35 ---      The Dot Counting Test: Raw Descriptor      E-Score 10 Within Expectation      Embedded Measures: Raw Descriptor      RBANS Effort Index: 2 Within Expectation      WAIS-IV Reliable Digit Span 9 Within Expectation      WAIS-IV Reliable Digit Span Revised 13 Within Expectation      Expected Functioning        Wide Range Achievement Test (Word Reading): Standard/Scaled Score Percentile       Word Reading 114 82      [employer] Intellectual Screening Test Standard/T-score Percentile      Guess What 52 58      Odd Item Out 56 73  RIST Index 107 68      Cognitive Testing        RBANS, Form : Standard/Scaled Score Percentile  Total Score 85 16  Immediate Memory 83 13      List Learning 3 1      Story Memory 11 63  Visuospatial/Constructional 100 50       Figure Copy   (20) 13 84      Judgment of Line Orientation   (14) --- 17-25  Language 99 47      Picture Naming --- 51-75      Semantic Fluency 10 50  Attention 91 27      Digit Span 12 75      Coding 5 5  Delayed Memory 75 5      List Recall   (1) --- <2      List Recognition   (15) --- <2      Story Recall   (10) 10 50      Figure Recall   (15) 11 63      Wechsler Adult Intelligence Scale - IV: Standard/Scaled Score Percentile  Working Memory Index 86 18      Digit Span 8 25          Digit Span Forward 9 37  Digit Span Backward 9 37          Digit Span Sequencing 8 25      Arithmetic 7 16  Processing Speed Index 84 14      Symbol Search 8 25      Coding 6 9      Neuropsychological Assessment Battery (Language Module): T-score Percentile      Naming   (31) 54 66      Verbal Fluency: T-score Percentile      Controlled Oral Word Association (F-A-S) 55 69      Semantic Fluency (Animals) 39 14      Trail Making Test: T-Score Percentile      Part A 50 50      Part B 66 95      Modified Wisconsin Card Sorting Test (MWCST): Standard/T-Score Percentile      Number of Categories Correct 54 66      Number of Perseverative Errors 46 34      Number of Total Errors 42 21      Percent Perseverative Errors 51 54  Executive Function Composite 100 50      Boston Diagnostic Aphasia Exam: Raw Score Scaled Score      Complex Ideational Material 10 7      Rating Scales         Raw Score Descriptor  Patient Health Questionnaire - 9 14 Moderate  GAD-7 13 Moderate  PCL-5 56 Positive   Peter V. Nicole Kindred PsyD, [place] Clinical Neuropsychologist
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
## 2021-02-13 ENCOUNTER — Ambulatory Visit (INDEPENDENT_AMBULATORY_CARE_PROVIDER_SITE_OTHER): Payer: No Typology Code available for payment source | Admitting: Counselor

## 2021-02-13 ENCOUNTER — Other Ambulatory Visit: Payer: Self-pay

## 2021-02-13 ENCOUNTER — Encounter: Payer: Self-pay | Admitting: Counselor

## 2021-02-13 DIAGNOSIS — F4310 Post-traumatic stress disorder, unspecified: Secondary | ICD-10-CM | POA: Diagnosis not present

## 2021-02-13 DIAGNOSIS — G4700 Insomnia, unspecified: Secondary | ICD-10-CM | POA: Diagnosis not present

## 2021-02-13 NOTE — Patient Instructions (Signed)
Your performance and presentation on assessment were consistent with fairly good performance for the most part, although you did have some scattered low scores in different areas. Specifically, you had a couple of low scores on measures of processing speed (involving attention) and on word list learning. You do not have a primary memory problem and I think your low word list learning score is more on the basis of what is called an executive control problem. Because these difficulties are mild and seem most likely related to reversible causes, I do not think this qualifies as a frank "mild neurocognitive disorder."  Executive control is a higher order cognitive ability involved in regulating other cognitive resources. Much like the conductor of an orchestra coordinates multiple instruments to make music, executive capacities coordinate other lower-order skills (e.g., movement, language, attention) to form complex human behaviors. Individuals with executive control problems are often capable of doing most of the things they did before they were having problems, but they may not do so as effortlessly, efficiently, and consistently. These difficulties often manifest as problems tracking information, multitasking, and paying attention. Executive control problems often result in cognitive inefficiency and can present as "memory problems," because they decrease encoding and spontaneous retrieval of information.   In your case, I think that your executive control problems are most likely on the basis of ongoing PTSD (chronic PTSD and acute PTSD have both been shown to cause memory problems in several different ways), insufficient sleep (which is probably in part related to PTSD), distraction related to chronic pain and the like. I think that working on these underlying issues will likely cause significant improvement. I cannot entirely rule out the possibility that your head injuries are playing a role but the injuries  you described are considered mild, and most people typically recover well following mild head injuries.   When an individual strikes their head and experiences an alteration of consciousness or a brief loss of consciousness without complications like a brain bleed, this is called a concussion or "mild traumatic brain injury." This is not a brain injury per se in terms of causing structural damage to the brain, but it does negatively impact the brain's functioning in terms of energy metabolism (i.e, it is a metabolic injury). Most people experience cognitive symptoms following a concussion, and these typically resolve within hours to days. The vast majority of individuals recover completely in a matter of time, without any measurable lasting cognitive impairments. A small minority of people may experience persisting symptoms, the so-called "postconcussion syndrome," and there is debate about why this is the case. Oftentimes, the symptoms that people report are things that happen with a relatively high frequency in the general population (e.g., things like irritability, difficulties concentrating, and headaches). These things are also associated with depression and anxiety, which are two of the biggest risk factors risk factors for postconcussion syndrome. Things like changes in routines, pain related to headaches, difficulties readjusting to work and normal activities, financial stress, depression, and anxiety about full recovery may be more important precipitants of postconcussive symptoms than the brain injury itself.   Avoid overfocusing on cognitive performance. Memory and cognition are notoriously fallible and if you are looking for cognitive problems, you are bound to find them. Once someone gets worried about their memory and thinking, they may overfocus on how they are doing day-to-day, and then when normal day-to-day cognitive errors are made, this becomes a cause for more concern. This concern and  anxiety then decreases focus from  the task at hand, reducing concentration, causing more cognitive problems, and creating a vicious cycle. Rather than critiquing your performance, I would encourage you to remain present minded and focus on the task at hand. Perhaps most importantly, have reasonable expectations for yourself.  Healthy people forget things, lose focus, and do not perform 100% correctly all the time. Some cognitive errors are normal and are not necessarily a sign that there is something wrong with your brain.   Difficulties of the sort that you describe and that you demonstrated on testing are often responsive to mindfulness. Most fundamentally, mindfulness involves taking a non-judgmental stance to the unfolding of the present moment and simply noticing your experience. When you are engaged in a task, this includes dedicating yourself to the task fully, without judgment, and simply redirecting yourself back to the task if you find your mind wandering. Various mindfulness practices including mindfulness based stress reduction, mindfulness medication, and the like have been shown to create subjective and objective improvements in cognitive function. You might consider picking up a copy of "Falling Awake: How to Practice Mindfulness in Everyday Life," by Lorelee Cover, Ph.D., who is one of the best known and most respected authors on the subject.  Family members can help you by being patient, sensitive, and kind regarding your cognitive errors. It is often unhelpful and may in fact be harmful to continually draw the affected individual back to the difficulties they are having.   There are few things as disruptive to brain functioning as not getting a good night's sleep. For sleep, I recommend against using medications, which can have lingering sedating effects on the brain and rob your brain of restful REM sleep. Instead, consider trying some of the following sleep hygiene recommendations.  They may not work at once and may take effort, but the effort you spend is likely to be rewarded with better sleep eventually:   Stick to a sleep schedule of the same bedtime and wake time even on the weekends, which can help to regulate your body's internal clock so that you fall asleep and stay asleep.   Practice a relaxing bedtime ritual (conducted away from bright lights) which will help separate your sleep from stimulating activities and prepare your body to fall asleep when you go to bed.   Avoid naps, especially in the afternoon.   Evaluate your room and create conditions that will promote sleep such as keeping it cool (between 60 - 67 degrees), quiet, and free from any lights. Consider using blackout curtains, a "white noise" generator, or fan that will help mask any noises that might prevent you from going to sleep or awaken you during the night.   Sleep on a comfortable mattress and pillows.   Avoid bright light in the evening and excessive use of portable electronic devices right before bed that may contain light frequencies that can contribute to sleep problems.   Avoid alcohol, cigarettes, or heavy meals in the evening. If you must eat, consume a light snack 45 minutes before bed.   Use your bed only for sleep to strengthen the association between your bed and sleep.   If you can't go to sleep within 30 minutes, go into another room and do something relaxing until you feel tired. Then, come back and try to go to sleep again for 30 minutes and repeat until sleep is achieved.   Some people find over the counter melatonin to be helpful for sleep, which you could discuss with a  pharmacist or prescribing provider.   CBT for Insomnia, a formal treatment with a very high degree of efficacy for sleep problems, is also often available within the [place] and you may consider looking into that.   Finally, you are already in counseling and psychiatry for your PTSD, which is good. You might  consider trying a manualized, targeted PTSD treatment if you have not, such as Cognitive Processing Therapy or Prolonged Exposure Therapy. Both of these treatments are readily available within the [place] and have an extremely good research base behind them. You are still clearly symptomatic of your PTSD and I think getting that under better control may be very helpful for multiple problems (pain, sleep, depression/anxiety, and cognitive difficulties).

## 2021-02-13 NOTE — Progress Notes (Signed)
NEUROPSYCHOLOGY FEEDBACK NOTE [facility]  Feedback Note: I met with [name] to review the findings resulting from his neuropsychological evaluation. Since the last appointment, he has been about the same. Time was spent reviewing the impressions and recommendations that are detailed in the evaluation report. We discussed impression of mild executive control issues, which are most likely due to modifiable risk factors as reflected in the patient instructions. I explored with [name] his involvement in PTSD treatment, and he stated that he has had no formal treatment. I provided my strong recommendation for him to consider a course of CPT or PE or another efficacious treatment, which he will discuss with his [facility] provider Dr. Eden Lathe. I explained I am happy to offer a referral for our resources. I took time to explain the findings and answer all the patient's questions. I encouraged [name] to contact me should he have any further questions or if further follow up is desired.   Current Medications and Medical History   Current Outpatient Medications  Medication Sig Dispense Refill  . chlorthalidone (HYGROTON) 25 MG tablet Take 12.5 mg by mouth daily.     . ondansetron (ZOFRAN) 4 MG tablet Take 1 tablet (4 mg total) by mouth every 8 (eight) hours as needed for nausea or vomiting. (Patient not taking: No sig reported) 20 tablet 0  . oxyCODONE-acetaminophen (PERCOCET) 10-325 MG tablet oxycodone-acetaminophen 10 mg-325 mg tablet  Take 1 tablet 3 times a day by oral route as needed for 10 days. (Patient not taking: Reported on 12/23/2020)     No current facility-administered medications for this visit.    Patient Active Problem List   Diagnosis Date Noted  . Fusion of lumbar spine 05/01/2020  . Allergic rhinitis 04/08/2020  . Depression 04/08/2020  . Hypertension 04/08/2020  . Sleep apnea 04/08/2020  . Somatic dysfunction of right sacroiliac joint 01/01/2020  . Cubital  tunnel syndrome 06/27/2019  . 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 Lumbar pain 05/19/2019  . Pain in left knee 01/19/2019  . Lumbar post-laminectomy syndrome 07/14/2017    Mental Status and Behavioral Observations  Cameran Pettey presented on time to the present encounter and was alert and generally oriented. Speech was normal in rate, rhythm, volume, and prosody. Self-reported mood was "allright" and affect was euthymic. Thought process was logical and goal oriented and thought content was appropriate to the topics discussed. There were no safety concerns identified at today's encounter, such as thoughts of harming self or others.   Plan  Feedback provided regarding the patient's neuropsychological evaluation. He will follow up with his [facility] provider regarding counseling for PTSD. No neuropsychological follow up needed. I reassured him regarding his head injuries, which seem unlikely the cause of his day-to-day problems. He will reach out if he needs referrals. [name] was encouraged to contact me if any questions arise or if further follow up is desired.   [name] Nicole Kindred, PsyD, ABN Clinical Neuropsychologist  Service(s) Provided at This Encounter: 68 minutes [phone]; Psychotherapy with patient/family)
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
## 2021-03-12 ENCOUNTER — Other Ambulatory Visit: Payer: Self-pay

## 2021-03-12 ENCOUNTER — Encounter: Payer: Self-pay | Admitting: Neurology

## 2021-03-12 ENCOUNTER — Ambulatory Visit (INDEPENDENT_AMBULATORY_CARE_PROVIDER_SITE_OTHER): Payer: No Typology Code available for payment source | Admitting: Neurology

## 2021-03-12 VITALS — BP 139/73 | HR 79 | Ht 70.0 in | Wt 208.8 lb

## 2021-03-12 DIAGNOSIS — F4310 Post-traumatic stress disorder, unspecified: Secondary | ICD-10-CM

## 2021-08-07 ENCOUNTER — Encounter: Payer: Self-pay | Admitting: Sports Medicine

## 2021-08-07 ENCOUNTER — Ambulatory Visit (INDEPENDENT_AMBULATORY_CARE_PROVIDER_SITE_OTHER): Payer: No Typology Code available for payment source | Admitting: Sports Medicine

## 2021-08-07 ENCOUNTER — Ambulatory Visit (INDEPENDENT_AMBULATORY_CARE_PROVIDER_SITE_OTHER): Payer: No Typology Code available for payment source

## 2021-08-07 ENCOUNTER — Other Ambulatory Visit: Payer: Self-pay

## 2021-08-07 ENCOUNTER — Other Ambulatory Visit: Payer: Self-pay | Admitting: Sports Medicine

## 2021-08-07 DIAGNOSIS — M1710 Unilateral primary osteoarthritis, unspecified knee: Secondary | ICD-10-CM | POA: Insufficient documentation

## 2021-08-07 DIAGNOSIS — D7589 Other specified diseases of blood and blood-forming organs: Secondary | ICD-10-CM | POA: Insufficient documentation

## 2021-08-07 DIAGNOSIS — T7840XA Allergy, unspecified, initial encounter: Secondary | ICD-10-CM | POA: Insufficient documentation

## 2021-08-07 DIAGNOSIS — M7662 Achilles tendinitis, left leg: Secondary | ICD-10-CM

## 2021-08-07 DIAGNOSIS — M7989 Other specified soft tissue disorders: Secondary | ICD-10-CM | POA: Insufficient documentation

## 2021-08-07 DIAGNOSIS — M170 Bilateral primary osteoarthritis of knee: Secondary | ICD-10-CM | POA: Insufficient documentation

## 2021-08-07 DIAGNOSIS — Z0289 Encounter for other administrative examinations: Secondary | ICD-10-CM | POA: Insufficient documentation

## 2021-08-07 DIAGNOSIS — M216X2 Other acquired deformities of left foot: Secondary | ICD-10-CM | POA: Diagnosis not present

## 2021-08-07 DIAGNOSIS — M722 Plantar fascial fibromatosis: Secondary | ICD-10-CM

## 2021-08-07 DIAGNOSIS — Z9889 Other specified postprocedural states: Secondary | ICD-10-CM | POA: Insufficient documentation

## 2021-08-07 DIAGNOSIS — F4312 Post-traumatic stress disorder, chronic: Secondary | ICD-10-CM | POA: Insufficient documentation

## 2021-08-07 DIAGNOSIS — H9120 Sudden idiopathic hearing loss, unspecified ear: Secondary | ICD-10-CM | POA: Insufficient documentation

## 2021-08-07 DIAGNOSIS — R198 Other specified symptoms and signs involving the digestive system and abdomen: Secondary | ICD-10-CM | POA: Insufficient documentation

## 2021-08-07 DIAGNOSIS — Z9049 Acquired absence of other specified parts of digestive tract: Secondary | ICD-10-CM | POA: Insufficient documentation

## 2021-08-07 DIAGNOSIS — S069X9A Unspecified intracranial injury with loss of consciousness of unspecified duration, initial encounter: Secondary | ICD-10-CM | POA: Insufficient documentation

## 2021-08-07 DIAGNOSIS — M792 Neuralgia and neuritis, unspecified: Secondary | ICD-10-CM | POA: Diagnosis not present

## 2021-08-07 DIAGNOSIS — M179 Osteoarthritis of knee, unspecified: Secondary | ICD-10-CM | POA: Insufficient documentation

## 2021-08-07 DIAGNOSIS — J329 Chronic sinusitis, unspecified: Secondary | ICD-10-CM | POA: Insufficient documentation

## 2021-08-07 DIAGNOSIS — L989 Disorder of the skin and subcutaneous tissue, unspecified: Secondary | ICD-10-CM | POA: Insufficient documentation

## 2021-08-07 DIAGNOSIS — Q667 Congenital pes cavus, unspecified foot: Secondary | ICD-10-CM

## 2021-08-07 DIAGNOSIS — S069XAA Unspecified intracranial injury with loss of consciousness status unknown, initial encounter: Secondary | ICD-10-CM | POA: Insufficient documentation

## 2021-08-07 DIAGNOSIS — H9209 Otalgia, unspecified ear: Secondary | ICD-10-CM | POA: Insufficient documentation

## 2021-08-07 DIAGNOSIS — F17200 Nicotine dependence, unspecified, uncomplicated: Secondary | ICD-10-CM | POA: Insufficient documentation

## 2021-08-07 DIAGNOSIS — M542 Cervicalgia: Secondary | ICD-10-CM | POA: Insufficient documentation

## 2021-08-07 DIAGNOSIS — H903 Sensorineural hearing loss, bilateral: Secondary | ICD-10-CM | POA: Insufficient documentation

## 2021-08-07 DIAGNOSIS — H9319 Tinnitus, unspecified ear: Secondary | ICD-10-CM | POA: Insufficient documentation

## 2021-08-07 DIAGNOSIS — F4329 Adjustment disorder with other symptoms: Secondary | ICD-10-CM | POA: Insufficient documentation

## 2021-08-07 DIAGNOSIS — L089 Local infection of the skin and subcutaneous tissue, unspecified: Secondary | ICD-10-CM | POA: Insufficient documentation

## 2021-08-07 DIAGNOSIS — R0600 Dyspnea, unspecified: Secondary | ICD-10-CM | POA: Insufficient documentation

## 2021-08-07 DIAGNOSIS — L708 Other acne: Secondary | ICD-10-CM | POA: Insufficient documentation

## 2021-08-07 DIAGNOSIS — F411 Generalized anxiety disorder: Secondary | ICD-10-CM | POA: Insufficient documentation

## 2021-08-07 DIAGNOSIS — M1990 Unspecified osteoarthritis, unspecified site: Secondary | ICD-10-CM | POA: Insufficient documentation

## 2021-08-07 DIAGNOSIS — G4700 Insomnia, unspecified: Secondary | ICD-10-CM | POA: Insufficient documentation

## 2021-08-07 DIAGNOSIS — E782 Mixed hyperlipidemia: Secondary | ICD-10-CM | POA: Insufficient documentation
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
## 2021-08-15 ENCOUNTER — Other Ambulatory Visit (HOSPITAL_COMMUNITY): Payer: Self-pay | Admitting: Nurse Practitioner

## 2021-08-15 ENCOUNTER — Other Ambulatory Visit: Payer: Self-pay | Admitting: Nurse Practitioner

## 2021-08-15 DIAGNOSIS — J329 Chronic sinusitis, unspecified: Secondary | ICD-10-CM

## 2021-09-01 ENCOUNTER — Encounter (HOSPITAL_COMMUNITY): Payer: Self-pay

## 2021-09-01 ENCOUNTER — Ambulatory Visit (HOSPITAL_COMMUNITY): Payer: No Typology Code available for payment source

## 2021-09-04 ENCOUNTER — Encounter: Payer: Self-pay | Admitting: Sports Medicine

## 2021-09-04 ENCOUNTER — Other Ambulatory Visit: Payer: Self-pay

## 2021-09-04 ENCOUNTER — Ambulatory Visit (INDEPENDENT_AMBULATORY_CARE_PROVIDER_SITE_OTHER): Payer: No Typology Code available for payment source | Admitting: Sports Medicine

## 2021-09-04 DIAGNOSIS — M7662 Achilles tendinitis, left leg: Secondary | ICD-10-CM | POA: Diagnosis not present

## 2021-09-04 DIAGNOSIS — Q667 Congenital pes cavus, unspecified foot: Secondary | ICD-10-CM

## 2021-09-04 DIAGNOSIS — M216X2 Other acquired deformities of left foot: Secondary | ICD-10-CM

## 2021-09-04 DIAGNOSIS — M792 Neuralgia and neuritis, unspecified: Secondary | ICD-10-CM | POA: Diagnosis not present
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
## 2021-09-11 ENCOUNTER — Encounter: Payer: Self-pay | Admitting: Gastroenterology

## 2021-09-23 ENCOUNTER — Ambulatory Visit
Admission: RE | Admit: 2021-09-23 | Discharge: 2021-09-23 | Disposition: A | Discharge status: Home / Self Care | Payer: No Typology Code available for payment source | Source: Ambulatory Visit | Attending: Sports Medicine | Admitting: Sports Medicine

## 2021-09-23 DIAGNOSIS — M7662 Achilles tendinitis, left leg: Secondary | ICD-10-CM

## 2021-09-30 ENCOUNTER — Ambulatory Visit (HOSPITAL_COMMUNITY)
Admission: RE | Admit: 2021-09-30 | Discharge: 2021-09-30 | Disposition: A | Discharge status: Home / Self Care | Payer: No Typology Code available for payment source | Source: Ambulatory Visit | Attending: Nurse Practitioner | Admitting: Nurse Practitioner

## 2021-09-30 ENCOUNTER — Other Ambulatory Visit: Payer: Self-pay

## 2021-09-30 DIAGNOSIS — J329 Chronic sinusitis, unspecified: Secondary | ICD-10-CM | POA: Insufficient documentation

## 2021-10-07 ENCOUNTER — Ambulatory Visit (INDEPENDENT_AMBULATORY_CARE_PROVIDER_SITE_OTHER): Payer: No Typology Code available for payment source | Admitting: Gastroenterology

## 2021-10-07 ENCOUNTER — Other Ambulatory Visit (INDEPENDENT_AMBULATORY_CARE_PROVIDER_SITE_OTHER): Payer: No Typology Code available for payment source

## 2021-10-07 ENCOUNTER — Encounter: Payer: Self-pay | Admitting: Gastroenterology

## 2021-10-07 VITALS — BP 128/78 | HR 61 | Ht 70.0 in | Wt 194.3 lb

## 2021-10-07 DIAGNOSIS — K625 Hemorrhage of anus and rectum: Secondary | ICD-10-CM

## 2021-10-07 DIAGNOSIS — R634 Abnormal weight loss: Secondary | ICD-10-CM

## 2021-10-07 DIAGNOSIS — R112 Nausea with vomiting, unspecified: Secondary | ICD-10-CM

## 2021-10-07 DIAGNOSIS — R630 Anorexia: Secondary | ICD-10-CM | POA: Diagnosis not present

## 2021-10-07 LAB — CBC WITH DIFFERENTIAL/PLATELET
Basophils Absolute: 0 10*3/uL (ref 0.0–0.1)
Basophils Relative: 0.6 % (ref 0.0–3.0)
Eosinophils Absolute: 0.1 10*3/uL (ref 0.0–0.7)
Eosinophils Relative: 1.3 % (ref 0.0–5.0)
HCT: 44.1 % (ref 39.0–52.0)
Hemoglobin: 14.9 g/dL (ref 13.0–17.0)
Lymphocytes Relative: 23.7 % (ref 12.0–46.0)
Lymphs Abs: 1.8 10*3/uL (ref 0.7–4.0)
MCHC: 33.8 g/dL (ref 30.0–36.0)
MCV: 91.1 fl (ref 78.0–100.0)
Monocytes Absolute: 0.5 10*3/uL (ref 0.1–1.0)
Monocytes Relative: 7 % (ref 3.0–12.0)
Neutro Abs: 5.1 10*3/uL (ref 1.4–7.7)
Neutrophils Relative %: 67.4 % (ref 43.0–77.0)
Platelets: 272 10*3/uL (ref 150.0–400.0)
RBC: 4.84 Mil/uL (ref 4.22–5.81)
RDW: 13.9 % (ref 11.5–15.5)
WBC: 7.6 10*3/uL (ref 4.0–10.5)

## 2021-10-07 LAB — COMPREHENSIVE METABOLIC PANEL
ALT: 30 U/L (ref 0–53)
AST: 25 U/L (ref 0–37)
Albumin: 4.9 g/dL (ref 3.5–5.2)
Alkaline Phosphatase: 60 U/L (ref 39–117)
BUN: 17 mg/dL (ref 6–23)
CO2: 28 mEq/L (ref 19–32)
Calcium: 9.5 mg/dL (ref 8.4–10.5)
Chloride: 102 mEq/L (ref 96–112)
Creatinine, Ser: 0.87 mg/dL (ref 0.40–1.50)
GFR: 97.29 mL/min (ref >60.00)
Glucose, Bld: 102 mg/dL — ABNORMAL HIGH (ref 70–99)
Potassium: 4.2 mEq/L (ref 3.5–5.1)
Sodium: 138 mEq/L (ref 135–145)
Total Bilirubin: 0.5 mg/dL (ref 0.2–1.2)
Total Protein: 7.4 g/dL (ref 6.0–8.3)

## 2021-10-07 MED ORDER — PEG 3350-KCL-NA BICARB-NACL 420 G PO SOLR: 4000.0000 mL | Freq: Once | ORAL | 0 refills | Status: AC

## 2021-10-07 NOTE — Progress Notes (Signed)
[facility] Consult Note:  History: [name] 10/07/2021  Referring provider: [facility], [place]  Reason for consult/chief complaint: Blood In Stools (Started 4 months ago, noticed once BRB in the stool, intermittent, low appetite, abd cramping, Sx improving now)   Subjective  HPI: I last saw [name] for a screening colonoscopy in June 2018, referred by [facility].  Complete exam, good preparation, diminutive hepatic flexure tubular adenoma removed.  5-year recall recommended (based on guidelines at that time).  He is referred back now for months of rectal bleeding and lower abdominal crampy pain.  [name] was here with his wife [name] and tells me that several months ago he developed a worrisome constellation of symptoms including decreased appetite, marked fatigue, and vomiting resulting in about a 20 pound weight loss.  He went to the [place] multiple times trying to be seen or get a referral to me.  He never was seen by a provider at the [place], and since his referral the time of this visit, symptoms have significantly abated.  He was also having some lower abdominal cramps more toward the right side and 2 episodes of rectal bleeding.  He is now significantly improved, his weight is stabilized but he is not put any back on.  Some mornings he still does not feel very hungry and his wife is concerned about that. [name] denies dysphagia or odynophagia.  He is otherwise feeling well these days.  ROS:  Review of Systems  Constitutional:  Negative for appetite change and unexpected weight change.  HENT:  Negative for mouth sores and voice change.   Eyes:  Negative for pain and redness.  Respiratory:  Negative for cough and shortness of breath.   Cardiovascular:  Negative for chest pain and palpitations.  Genitourinary:  Negative for dysuria and hematuria.  Musculoskeletal:  Negative for arthralgias and myalgias.  Skin:   Negative for pallor and rash.  Neurological:  Negative for weakness and headaches.  Hematological:  Negative for adenopathy.  Psychiatric/Behavioral:         Mood stable    Past Medical History: Past Medical History:  Diagnosis Date   Anxiety    ptsd   Arthritis    Depression    Family history of adverse reaction to anesthesia    Grandfather "woke up confused"?    Headache    Heart murmur    "years ago" ; denies symptom    Hypertension    Left ear hearing loss    Low back pain    PONV (postoperative nausea and vomiting)    Radicular leg pain    Intermittent Right leg pain   Sleep apnea      Past Surgical History: Past Surgical History:  Procedure Laterality Date   ABDOMINAL EXPOSURE N/A 05/01/2020   Procedure: ABDOMINAL EXPOSURE;  Surgeon: Rosetta Posner, MD;  Location: [place] [facility];  Service: Vascular;  Laterality: N/A;   APPENDECTOMY  11/23/2017   BACK SURGERY     BILATERAL KNEE ARTHROSCOPY     c5-6     removal of fusion and spacers   CERVICAL SPINE SURGERY     2015,OCTOBER   COLONOSCOPY     IR FLUORO GUIDED NEEDLE PLC ASPIRATION/INJECTION LOC  12/19/2019   LUMBAR LAMINECTOMY/DECOMPRESSION MICRODISCECTOMY N/A 07/14/2017   Procedure: CENTRAL MICROLUMBAR DECOMPRESSION OF L3-L4 AND L4-L5,  AND EXCISION OF LIPOMA;  Surgeon: Susa Day, MD;  Location: [facility];  Service: Orthopedics;  Laterality: N/A;   ORTHOPAEDIC SURGERY  06/15/2019   Since I last saw him, [name] had "a major neck surgery" at [facility] and a lumbar fusion as well.  He appears to have had extensive cervical fusion and has a lengthy posterior neck to upper back scar from that  Family History: Family History  Problem Relation Age of Onset   Hepatitis Mother    Lung cancer Mother    Colon cancer Neg Hx    Cancer - Colon Neg Hx    Rectal cancer Neg Hx    Liver cancer Neg Hx    Stomach cancer Neg Hx    Pancreatic cancer Neg Hx     Social History: Social History   Socioeconomic History   Marital  status: Married    Spouse name: Not on file   Number of children: 0   Years of education: Not on file   Highest education level: Not on file  Occupational History    Comment: No longer working  Tobacco Use   Smoking status: Former    Types: Cigarettes    Quit date: 12/24/2012    Years since quitting: 8.7   Smokeless tobacco: Never  Vaping Use   Vaping Use: Never used  Substance and Sexual Activity   Alcohol use: Yes    Alcohol/week: 14.0 standard drinks    Types: 7 Glasses of wine, 7 Cans of beer per week   Drug use: No   Sexual activity: Not on file  Other Topics Concern   Not on file  Social History Narrative   Right handed   3 story home   Drinks caffeine   Social Determinants of Health   Financial Resource Strain: Not on file  Food Insecurity: Not on file  Transportation Needs: Not on file  Physical Activity: Not on file  Stress: Not on file  Social Connections: Not on file    Allergies: Allergies  Allergen Reactions   Lisinopril Swelling, Rash and Anaphylaxis   Tramadol Other (See Comments)    "complete blackout;" can't remember a thing Extremely Violent, Blackout   Amlodipine     Other reaction(s): Swollen ankle, Swollen ankle region   Gabapentin Itching   Venlafaxine Nausea And Vomiting    Outpatient Meds: Current Outpatient Medications  Medication Sig Dispense Refill   chlorthalidone (HYGROTON) 25 MG tablet Take 12.5 mg by mouth daily.      fexofenadine (ALLEGRA) 180 MG tablet Take 1 tablet by mouth daily.     lidocaine (XYLOCAINE) 5 % ointment APPLY A SMALL AMOUNT TOPICALLY EVERY 6 HOURS AS NEEDED FOR BILATERAL KNEE PAIN     loratadine (CLARITIN) 10 MG tablet Take 1 tablet by mouth daily as needed.     polyethylene glycol-electrolytes (NULYTELY) 420 g solution Take 4,000 mLs by mouth once for 1 dose. 4000 mL 0   No current facility-administered medications for this visit.       ___________________________________________________________________ Objective   Exam:  BP 128/78   Pulse 61   Ht 5\' 10"  (1.778 m)   Wt 194 lb 5 oz (88.1 kg)   SpO2 99%   BMI 27.88 kg/m  Wt Readings from Last 3 Encounters:  10/07/21 194 lb 5 oz (88.1 kg)  03/12/21 208 lb 12.8 oz (94.7 kg)  12/23/20 210 lb (95.3 kg)    General: Well-appearing, normal vocal quality.  No muscle wasting. He has limited anterior posterior neck range of motion, better left right ROM Eyes: sclera anicteric, no redness ENT: oral mucosa moist without lesions, no cervical or supraclavicular lymphadenopathy  CV: RRR without murmur, S1/S2, no JVD, no peripheral edema Resp: clear to auscultation bilaterally, normal RR and effort noted GI: soft, no tenderness, with active bowel sounds. No guarding or palpable organomegaly noted. Skin; warm and dry, no rash or jaundice noted.  Multiple tattoos Neuro: awake, alert and oriented x 3. Normal gross motor function and fluent speech Rectal: Normal external, no fissure tenderness or palpable internal lesion on DRE  Labs:  No recent data for review (no clinical notes accompanied [facility])  Assessment: Encounter Diagnoses  Name Primary?   Rectal bleeding Yes   Weight loss    Loss of appetite    Nausea and vomiting in adult     Several months ago [name] had about a month and a half of worrisome symptoms that were primarily upper digestive, but also some lower abdominal crampy pain and 2 episodes of rectal bleeding. Feeling much better lately, still some residual appetite issues but apparently nothing like what he had before.  Not clear what may have caused this, went on to long to likely have likely been something infectious.  He had significant weight loss but since stabilized.  Plan: Upper endoscopy and colonoscopy recommended.  He was agreeable after discussion of procedure and risks  The benefits and risks of the planned procedure were  described in detail with the patient or (when appropriate) their health care proxy.  Risks were outlined as including, but not limited to, bleeding, infection, perforation, adverse medication reaction leading to cardiac or pulmonary decompensation, pancreatitis (if ERCP).  The limitation of incomplete mucosal visualization was also discussed.  No guarantees or warranties were given.  Patient at increased risk for cardiopulmonary complications of procedure due to medical comorbidities. Due to his extensive cervical fusion and limited neck ROM, he would be considered a high risk airway.  Therefore, his procedures must be done in the hospital outpatient endoscopy lab.  Next available appointment was in about 8 weeks.  CBC and CMP today  Thank you for the courtesy of this consult.  Please call me with any questions or concerns.  Nelida Meuse III  CC: Referring provider noted above

## 2021-10-07 NOTE — Patient Instructions (Signed)
If you are age 55 or older, your body mass index should be between 23-30. Your Body mass index is 27.88 kg/m. If this is out of the aforementioned range listed, please consider follow up with your Primary Care Provider.  If you are age 39 or younger, your body mass index should be between 19-25. Your Body mass index is 27.88 kg/m. If this is out of the aformentioned range listed, please consider follow up with your Primary Care Provider.   ________________________________________________________  The [facility] providers would like to encourage you to use [facility] to communicate with providers for non-urgent requests or questions.  Due to long hold times on the telephone, sending your provider a message by [facility] may be a faster and more efficient way to get a response.  Please allow 48 business hours for a response.  Please remember that this is for non-urgent requests.  _______________________________________________________  Dennis Bast have been scheduled for a colonoscopy/EGD. Please follow written instructions given to you at your visit today.  Please pick up your prep supplies at the pharmacy within the next 1-3 days. If you use inhalers (even only as needed), please bring them with you on the day of your procedure.  Your provider has requested that you go to the basement level for lab work before leaving today. Press "B" on the elevator. The lab is located at the first door on the left as you exit the elevator.  Due to recent changes in healthcare laws, you may see the results of your imaging and laboratory studies on MyChart before your provider has had a chance to review them.  We understand that in some cases there may be results that are confusing or concerning to you. Not all laboratory results come back in the same time frame and the provider may be waiting for multiple results in order to interpret others.  Please give [place] 48 hours in order for your provider to thoroughly review all the  results before contacting the office for clarification of your results.    It was a pleasure to see you today!  Thank you for trusting me with your gastrointestinal care!

## 2021-11-25 ENCOUNTER — Encounter (HOSPITAL_COMMUNITY): Payer: Self-pay | Admitting: Gastroenterology

## 2021-12-02 ENCOUNTER — Telehealth: Payer: Self-pay

## 2021-12-02 NOTE — Telephone Encounter (Signed)
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 Pt's appt has been moved up to this slot. I called and spoke with patient. He is aware that he will now need to arrive at [facility] by 8:45 am with a care partner. Pt verbalized understanding and had no concerns at the end of the call.

## 2021-12-04 ENCOUNTER — Ambulatory Visit (HOSPITAL_COMMUNITY): Payer: No Typology Code available for payment source | Admitting: Certified Registered"

## 2021-12-04 ENCOUNTER — Encounter (HOSPITAL_COMMUNITY): Payer: Self-pay | Admitting: Gastroenterology

## 2021-12-04 ENCOUNTER — Other Ambulatory Visit: Payer: Self-pay

## 2021-12-04 ENCOUNTER — Ambulatory Visit (HOSPITAL_COMMUNITY)
Admission: RE | Admit: 2021-12-04 | Discharge: 2021-12-04 | Disposition: A | Discharge status: Home / Self Care | Payer: No Typology Code available for payment source | Source: Ambulatory Visit | Attending: Gastroenterology | Admitting: Gastroenterology

## 2021-12-04 ENCOUNTER — Encounter (HOSPITAL_COMMUNITY)
Admission: RE | Disposition: A | Discharge status: Home / Self Care | Payer: Self-pay | Source: Ambulatory Visit | Attending: Gastroenterology

## 2021-12-04 DIAGNOSIS — K625 Hemorrhage of anus and rectum: Secondary | ICD-10-CM | POA: Insufficient documentation

## 2021-12-04 DIAGNOSIS — R634 Abnormal weight loss: Secondary | ICD-10-CM | POA: Diagnosis not present

## 2021-12-04 DIAGNOSIS — R112 Nausea with vomiting, unspecified: Secondary | ICD-10-CM | POA: Diagnosis not present

## 2021-12-04 DIAGNOSIS — D122 Benign neoplasm of ascending colon: Secondary | ICD-10-CM | POA: Diagnosis not present

## 2021-12-04 DIAGNOSIS — K3189 Other diseases of stomach and duodenum: Secondary | ICD-10-CM | POA: Diagnosis not present

## 2021-12-04 DIAGNOSIS — K5140 Inflammatory polyps of colon without complications: Secondary | ICD-10-CM | POA: Insufficient documentation

## 2021-12-04 DIAGNOSIS — R630 Anorexia: Secondary | ICD-10-CM

## 2021-12-04 DIAGNOSIS — D125 Benign neoplasm of sigmoid colon: Secondary | ICD-10-CM

## 2021-12-04 DIAGNOSIS — K648 Other hemorrhoids: Secondary | ICD-10-CM | POA: Insufficient documentation

## 2021-12-04 DIAGNOSIS — K5730 Diverticulosis of large intestine without perforation or abscess without bleeding: Secondary | ICD-10-CM | POA: Diagnosis not present

## 2021-12-04 HISTORY — PX: ESOPHAGOGASTRODUODENOSCOPY (EGD) WITH PROPOFOL: SHX5813

## 2021-12-04 HISTORY — PX: COLONOSCOPY WITH PROPOFOL: SHX5780

## 2021-12-04 HISTORY — PX: POLYPECTOMY: SHX5525

## 2021-12-04 HISTORY — PX: BIOPSY: SHX5522

## 2021-12-04 SURGERY — ESOPHAGOGASTRODUODENOSCOPY (EGD) WITH PROPOFOL
Anesthesia: Monitor Anesthesia Care

## 2021-12-04 MED ORDER — LIDOCAINE HCL (CARDIAC) PF 100 MG/5ML IV SOSY
PREFILLED_SYRINGE | INTRAVENOUS | Status: DC | PRN
  Administered 2021-12-04: 60 mg via INTRAVENOUS

## 2021-12-04 MED ORDER — PROPOFOL 1000 MG/100ML IV EMUL
INTRAVENOUS | Status: AC
  Filled 2021-12-04: qty 100

## 2021-12-04 MED ORDER — SODIUM CHLORIDE 0.9 % IV SOLN
INTRAVENOUS | Status: DC
Start: 2021-12-04 — End: 2021-12-04

## 2021-12-04 MED ORDER — PROPOFOL 500 MG/50ML IV EMUL
INTRAVENOUS | Status: DC | PRN
  Administered 2021-12-04: 350 ug/kg/min via INTRAVENOUS

## 2021-12-04 MED ORDER — GLYCOPYRROLATE 0.2 MG/ML IJ SOLN
INTRAMUSCULAR | Status: DC | PRN
  Administered 2021-12-04: .1 mg via INTRAVENOUS

## 2021-12-04 MED ORDER — LACTATED RINGERS IV SOLN
INTRAVENOUS | Status: DC
  Administered 2021-12-04: 1000 mL via INTRAVENOUS

## 2021-12-04 SURGICAL SUPPLY — 25 items

## 2021-12-04 NOTE — Anesthesia Procedure Notes (Signed)
Procedure Name: MAC Date/Time: 12/04/2021 10:20 AM Performed by: Josephine Igo, CRNA Patient Re-evaluated:Patient Re-evaluated prior to induction Oxygen Delivery Method: Simple face mask Preoxygenation: Pre-oxygenation with 100% oxygen Placement Confirmation: positive ETCO2

## 2021-12-04 NOTE — Discharge Instructions (Signed)

## 2021-12-04 NOTE — Anesthesia Postprocedure Evaluation (Signed)
Anesthesia Post Note  Patient: [name]  Procedure(s) Performed: ESOPHAGOGASTRODUODENOSCOPY (EGD) WITH PROPOFOL COLONOSCOPY WITH PROPOFOL POLYPECTOMY BIOPSY     Patient location during evaluation: PACU Anesthesia Type: MAC Level of consciousness: awake and alert Pain management: pain level controlled Vital Signs Assessment: post-procedure vital signs reviewed and stable Respiratory status: spontaneous breathing, nonlabored ventilation, respiratory function stable and patient connected to nasal cannula oxygen Cardiovascular status: stable and blood pressure returned to baseline Postop Assessment: no apparent nausea or vomiting Anesthetic complications: no   No notable events documented.  Last Vitals:  Vitals:   12/04/21 1104 12/04/21 1114  BP: 123/65 (!) 141/94  Pulse: 65 61  Resp: (!) 22 14  Temp: 36.6 C   SpO2: 96% 100%    Last Pain:  Vitals:   12/04/21 1114  TempSrc:   PainSc: 0-No pain                 Tiajuana Amass

## 2021-12-04 NOTE — H&P (Signed)
History and Physical:  This patient presents for endoscopic testing for: Upper abdominal pain Nausea Rectal bleeding Weight loss  Clinical details in office consult note of 10/07/2021.  Patient states that since then his weight has remained stable, he is only felt ill on one day with about 4 hours of nausea and "upset stomach" in the morning.  He denies vomiting, ongoing abdominal pain, no further rectal bleeding.  ROS: Patient denies chest pain or cough   Past Medical History: Past Medical History:  Diagnosis Date   Anxiety    ptsd   Arthritis    Depression    Family history of adverse reaction to anesthesia    Grandfather "woke up confused"?    Headache    Heart murmur    "years ago" ; denies symptom    Hypertension    Left ear hearing loss    Low back pain    PONV (postoperative nausea and vomiting)    Radicular leg pain    Intermittent Right leg pain   Sleep apnea      Past Surgical History: Past Surgical History:  Procedure Laterality Date   ABDOMINAL EXPOSURE N/A 05/01/2020   Procedure: ABDOMINAL EXPOSURE;  Surgeon: Rosetta Posner, MD;  Location: [place] [facility];  Service: Vascular;  Laterality: N/A;   APPENDECTOMY  11/23/2017   BACK SURGERY     BILATERAL KNEE ARTHROSCOPY     c5-6     removal of fusion and spacers   CERVICAL SPINE SURGERY     2015,OCTOBER   COLONOSCOPY     IR FLUORO GUIDED NEEDLE PLC ASPIRATION/INJECTION LOC  12/19/2019   LUMBAR LAMINECTOMY/DECOMPRESSION MICRODISCECTOMY N/A 07/14/2017   Procedure: CENTRAL MICROLUMBAR DECOMPRESSION OF L3-L4 AND L4-L5,  AND EXCISION OF LIPOMA;  Surgeon: Susa Day, MD;  Location: [facility];  Service: Orthopedics;  Laterality: N/A;   ORTHOPAEDIC SURGERY  06/15/2019    Allergies: Allergies  Allergen Reactions   Lisinopril Swelling, Rash and Anaphylaxis   Tramadol Other (See Comments)    "complete blackout;" can't remember a thing Extremely Violent, Blackout   Amlodipine     Other reaction(s): Swollen ankle, Swollen  ankle region   Gabapentin Itching   Venlafaxine Nausea And Vomiting    Outpatient Meds: Current Facility-Administered Medications  Medication Dose Route Frequency Provider Last Rate Last Admin   0.9 %  sodium chloride infusion   Intravenous Continuous Danis, Estill Cotta III, MD          ___________________________________________________________________ Objective   Exam:  BP (!) 169/74    Pulse 73    Temp (!) 97.1 F (36.2 C) (Oral)    Resp 12    Ht 5\' 9"  (1.753 m)    Wt 88.5 kg    SpO2 100%    BMI 28.80 kg/m   CV: RRR without murmur, S1/S2 Resp: clear to auscultation bilaterally, normal RR and effort noted GI: soft, no tenderness, with active bowel sounds.   Assessment: Upper abdominal pain Nausea Rectal bleeding Weight loss   Plan: Colonoscopy EGD  The benefits and risks of the planned procedure were described in detail with the patient or (when appropriate) their health care proxy.  Risks were outlined as including, but not limited to, bleeding, infection, perforation, adverse medication reaction leading to cardiac or pulmonary decompensation, pancreatitis (if ERCP).  The limitation of incomplete mucosal visualization was also discussed.  No guarantees or warranties were given. (Consent obtained in preprocedure area and presents with patient's nurse)    The patient is appropriate for  an endoscopic procedure in the ambulatory setting.   - Wilfrid Lund, MD

## 2021-12-04 NOTE — Op Note (Addendum)
[facility] [name] Procedure Date: 12/04/2021 MRN: [id number] Attending MD: Estill Cotta. Loletha Carrow , MD Date of Birth: 01/20/1966 CSN: [id number] Age: 57 Admit Type: Outpatient Procedure:                Upper GI endoscopy Indications:              Upper abdominal pain, Nausea with vomiting, Weight                            loss (protracted illness months ago, nearly all                            resolved now except occasional nausea) Providers:                Estill Cotta. Loletha Carrow, MD, Dulcy Fanny, Luan Moore, Technician, Lelon Perla, CRNA Referring MD:              Medicines:                Monitored Anesthesia Care Complications:            No immediate complications. Estimated Blood Loss:     Estimated blood loss was minimal. Procedure:                Pre-Anesthesia Assessment:                           - Prior to the procedure, a History and Physical                            was performed, and patient medications and                            allergies were reviewed. The patient's tolerance of                            previous anesthesia was also reviewed. The risks                            and benefits of the procedure and the sedation                            options and risks were discussed with the patient.                            All questions were answered, and informed consent                            was obtained. Prior Anticoagulants: The patient has                            taken no previous anticoagulant or antiplatelet  agents. ASA Grade Assessment: II - A patient with                            mild systemic disease. After reviewing the risks                            and benefits, the patient was deemed in                            satisfactory condition to undergo the procedure.                           After obtaining informed consent, the endoscope was                             passed under direct vision. Throughout the                            procedure, the patient's blood pressure, pulse, and                            oxygen saturations were monitored continuously. The                            [id number]-H190 ([id number]) Olympus endoscope was introduced                            through the mouth, and advanced to the second part                            of duodenum. The upper GI endoscopy was                            accomplished without difficulty. The patient                            tolerated the procedure well. Scope In: Scope Out: Findings:      The esophagus was normal.      Normal mucosa was found in the entire examined stomach. Biopsies (antrum       and body) were taken with a cold forceps for histology.      Mildly scalloped mucosa was found in the second portion of the duodenum.       Biopsies were taken with a cold forceps for histology.      The exam of the duodenum was otherwise normal. Impression:               - Normal esophagus.                           - Normal mucosa was found in the entire stomach.                            Biopsied.                           -  Scalloped mucosa was found in the duodenum, rule                            out celiac disease. Biopsied. Moderate Sedation:      MAC sedation used Recommendation:           - Patient has a contact number available for                            emergencies. The signs and symptoms of potential                            delayed complications were discussed with the                            patient. Return to normal activities tomorrow.                            Written discharge instructions were provided to the                            patient.                           - Resume previous diet.                           - Continue present medications.                           - Await pathology results.                           - See the other  procedure note for documentation of                            additional recommendations. Procedure Code(s):        --- Professional ---                           [id number], Esophagogastroduodenoscopy, flexible,                            transoral; with biopsy, single or multiple Diagnosis Code(s):        --- Professional ---                           K31.89, Other diseases of stomach and duodenum                           R10.10, Upper abdominal pain, unspecified                           R11.2, Nausea with vomiting, unspecified                           R63.4, Abnormal weight loss CPT copyright 2019 American  Medical Association. All rights reserved. The codes documented in this report are preliminary and upon coder review may  be revised to meet current compliance requirements. [name] L. Loletha Carrow, MD 12/04/2021 10:57:35 AM This report has been signed electronically. Number of Addenda: 0

## 2021-12-04 NOTE — Anesthesia Preprocedure Evaluation (Signed)
Anesthesia Evaluation  Patient identified by MRN, date of birth, ID band Patient awake    Reviewed: Allergy & Precautions, NPO status , Patient's Chart, lab work & pertinent test results  Airway Mallampati: III  TM Distance: >3 FB Neck ROM: Full    Dental   Pulmonary sleep apnea , former smoker,    breath sounds clear to auscultation       Cardiovascular hypertension, Pt. on medications  Rhythm:Regular Rate:Normal     Neuro/Psych  Neuromuscular disease    GI/Hepatic negative GI ROS, Neg liver ROS,   Endo/Other  negative endocrine ROS  Renal/GU negative Renal ROS     Musculoskeletal  (+) Arthritis ,   Abdominal   Peds  Hematology negative hematology ROS (+)   Anesthesia Other Findings   Reproductive/Obstetrics                             Anesthesia Physical Anesthesia Plan  ASA: 3  Anesthesia Plan: MAC   Post-op Pain Management: Minimal or no pain anticipated   Induction:   PONV Risk Score and Plan: Propofol infusion and Treatment may vary due to age or medical condition  Airway Management Planned: Natural Airway and Nasal Cannula  Additional Equipment:   Intra-op Plan:   Post-operative Plan:   Informed Consent: I have reviewed the patients History and Physical, chart, labs and discussed the procedure including the risks, benefits and alternatives for the proposed anesthesia with the patient or authorized representative who has indicated his/her understanding and acceptance.       Plan Discussed with:   Anesthesia Plan Comments:         Anesthesia Quick Evaluation

## 2021-12-04 NOTE — Transfer of Care (Signed)
Immediate Anesthesia Transfer of Care Note  Patient: [name]  Procedure(s) Performed: ESOPHAGOGASTRODUODENOSCOPY (EGD) WITH PROPOFOL COLONOSCOPY WITH PROPOFOL POLYPECTOMY BIOPSY  Patient Location: PACU  Anesthesia Type:MAC  Level of Consciousness: awake and patient cooperative  Airway & Oxygen Therapy: Patient Spontanous Breathing and Patient connected to face mask oxygen  Post-op Assessment: Report given to RN and Post -op Vital signs reviewed and stable  Post vital signs: Reviewed and stable  Last Vitals:  Vitals Value Taken Time  BP    Temp    Pulse 77 12/04/21 1055  Resp 18 12/04/21 1055  SpO2 100 % 12/04/21 1055  Vitals shown include unvalidated device data.  Last Pain:  Vitals:   12/04/21 1003  TempSrc: Oral  PainSc: 0-No pain         Complications: No notable events documented.

## 2021-12-04 NOTE — Op Note (Signed)
[facility] [name] Procedure Date: 12/04/2021 MRN: [id number] Attending MD: Estill Cotta. Loletha Carrow , MD Date of Birth: [birth year] CSN: [id number] Age: 56 Admit Type: Outpatient Procedure:                Colonoscopy Indications:              Rectal bleeding (during a protracted GI illness) Providers:                Estill Cotta. Loletha Carrow, MD, Dulcy Fanny, Luan Moore, Technician, Lelon Perla, CRNA Referring MD:              Medicines:                Monitored Anesthesia Care Complications:            No immediate complications. Estimated Blood Loss:     Estimated blood loss was minimal. Procedure:                Pre-Anesthesia Assessment:                           - Prior to the procedure, a History and Physical                            was performed, and patient medications and                            allergies were reviewed. The patient's tolerance of                            previous anesthesia was also reviewed. The risks                            and benefits of the procedure and the sedation                            options and risks were discussed with the patient.                            All questions were answered, and informed consent                            was obtained. Prior Anticoagulants: The patient has                            taken no previous anticoagulant or antiplatelet                            agents. ASA Grade Assessment: II - A patient with                            mild systemic disease. After reviewing the risks  and benefits, the patient was deemed in                            satisfactory condition to undergo the procedure.                           After obtaining informed consent, the colonoscope                            was passed under direct vision. Throughout the                            procedure, the patient's blood pressure, pulse, and                             oxygen saturations were monitored continuously. The                            [id number]-HQ190L ([id number]) Olympus colonoscope was                            introduced through the anus and advanced to the the                            terminal ileum, with identification of the                            appendiceal orifice and IC valve. The colonoscopy                            was performed without difficulty. The patient                            tolerated the procedure well. The quality of the                            bowel preparation was excellent. The terminal                            ileum, ileocecal valve, appendiceal orifice, and                            rectum were photographed. Scope In: 10:24:05 AM Scope Out: 10:39:19 AM Scope Withdrawal Time: 0 hours 12 minutes 3 seconds  Total Procedure Duration: 0 hours 15 minutes 14 seconds  Findings:      The perianal and digital rectal examinations were normal.      The terminal ileum appeared normal.      Two sessile polyps were found in the sigmoid colon and ascending colon.       The polyps were diminutive in size. These polyps were removed with a       cold snare. Resection and retrieval were complete.      A few small-mouthed diverticula were found in the left colon.      Internal hemorrhoids were found.  The exam was otherwise without abnormality on direct and retroflexion       views. Impression:               - The examined portion of the ileum was normal.                           - Two diminutive polyps in the sigmoid colon and in                            the ascending colon, removed with a cold snare.                            Resected and retrieved.                           - Diverticulosis in the left colon.                           - Internal hemorrhoids.                           - The examination was otherwise normal on direct                            and retroflexion  views. Moderate Sedation:      MAC sedation used Recommendation:           - Patient has a contact number available for                            emergencies. The signs and symptoms of potential                            delayed complications were discussed with the                            patient. Return to normal activities tomorrow.                            Written discharge instructions were provided to the                            patient.                           - Resume previous diet.                           - Continue present medications.                           - Await pathology results.                           - Repeat colonoscopy is recommended for  surveillance. The colonoscopy date will be                            determined after pathology results from today's                            exam become available for review. Procedure Code(s):        --- Professional ---                           [phone], Colonoscopy, flexible; with removal of                            tumor(s), polyp(s), or other lesion(s) by snare                            technique Diagnosis Code(s):        --- Professional ---                           K64.8, Other hemorrhoids                           K63.5, Polyp of colon                           K62.5, Hemorrhage of anus and rectum                           K57.30, Diverticulosis of large intestine without                            perforation or abscess without bleeding CPT copyright 2019 American Medical Association. All rights reserved. The codes documented in this report are preliminary and upon coder review may  be revised to meet current compliance requirements. Kelle Ruppert L. Loletha Carrow, MD 12/04/2021 10:53:56 AM This report has been signed electronically. Number of Addenda: 0

## 2021-12-05 ENCOUNTER — Encounter (HOSPITAL_COMMUNITY): Payer: Self-pay | Admitting: Gastroenterology

## 2021-12-05 LAB — SURGICAL PATHOLOGY

## 2021-12-08 ENCOUNTER — Encounter: Payer: Self-pay | Admitting: Gastroenterology

## 2021-12-16 ENCOUNTER — Telehealth: Payer: Self-pay | Admitting: Gastroenterology

## 2021-12-16 NOTE — Telephone Encounter (Signed)
Inbound call from [facility] states she need procedure notes for patient 1/12 faxed to [phone]

## 2021-12-16 NOTE — Telephone Encounter (Signed)
1/12 EGD/colon report and pathology report faxed to [facility] as requested to fax # that was provided.

## 2022-04-16 ENCOUNTER — Other Ambulatory Visit: Payer: Self-pay

## 2022-04-16 ENCOUNTER — Ambulatory Visit (INDEPENDENT_AMBULATORY_CARE_PROVIDER_SITE_OTHER): Payer: Medicare Other | Admitting: Internal Medicine

## 2022-04-16 ENCOUNTER — Encounter: Payer: Self-pay | Admitting: Internal Medicine

## 2022-04-16 DIAGNOSIS — F411 Generalized anxiety disorder: Secondary | ICD-10-CM

## 2022-04-16 DIAGNOSIS — G4733 Obstructive sleep apnea (adult) (pediatric): Secondary | ICD-10-CM

## 2022-04-16 DIAGNOSIS — H903 Sensorineural hearing loss, bilateral: Secondary | ICD-10-CM | POA: Diagnosis not present

## 2022-04-16 DIAGNOSIS — S069XAS Unspecified intracranial injury with loss of consciousness status unknown, sequela: Secondary | ICD-10-CM

## 2022-04-16 DIAGNOSIS — I10 Essential (primary) hypertension: Secondary | ICD-10-CM | POA: Diagnosis present

## 2022-04-16 DIAGNOSIS — Z87891 Personal history of nicotine dependence: Secondary | ICD-10-CM

## 2022-04-16 MED ORDER — PROPRANOLOL HCL 20 MG PO TABS: 20.0000 mg | ORAL_TABLET | ORAL | 2 refills | Status: DC | PRN
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
## 2022-04-16 NOTE — Assessment & Plan Note (Signed)
He has history of hypertension previously took chlorthalidone he currently is not taking any medication.  He prefers herbal tea.  Blood pressure is mildly elevated today.  I printed out information on nonpharmacologic treatments of his blood pressure and asked him to monitor it regularly at home.

## 2022-04-16 NOTE — Assessment & Plan Note (Signed)
He reports history of sleep apnea however he was not able to tolerate CPAP therapy due to tossing and turning at night.
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
## 2022-04-16 NOTE — Assessment & Plan Note (Signed)
He reports a history of anxiety relating to PTSD from service.  He specifically becomes most anxious in large groups of people feels hyperalert and can get agitated.  He tries to avoid large groups of people.  He does not like taking chronic medications but is willing to take a short acting medication if it were to help him.  His wife would like him to be able to occasionally go out and assist her she is currently having some balance issues.  I discussed with him trying 20 mg of propanolol to see if this would help with panic/anxiety attacks.

## 2022-04-16 NOTE — Assessment & Plan Note (Signed)
He has a history of TBI from his service and [place].  Occasionally has headaches and he also has hearing loss in his left ear.

## 2022-07-02 ENCOUNTER — Other Ambulatory Visit: Payer: Self-pay | Admitting: Internal Medicine

## 2022-07-02 MED ORDER — CLONAZEPAM 0.5 MG PO TABS: 0.5000 mg | ORAL_TABLET | Freq: Two times a day (BID) | ORAL | 0 refills | Status: DC | PRN

## 2022-07-02 NOTE — Progress Notes (Signed)
[name] was present at his wife's visit today.  They are undergoing quite a bit of stress due to a healthcare issue that [name] has for which she has not planned upcoming neurosurgery.  He is very anxious and they both report a lot of stress to the relationship and anxiety.  He was requesting a short-term anxiolytic to help.  I think this is reasonable and we will give him a short course of Klonopin to take as needed over the next 2 weeks or so.

## 2022-10-22 ENCOUNTER — Encounter: Payer: Self-pay | Admitting: Internal Medicine

## 2022-10-22 ENCOUNTER — Other Ambulatory Visit: Payer: Self-pay

## 2022-10-22 ENCOUNTER — Ambulatory Visit (INDEPENDENT_AMBULATORY_CARE_PROVIDER_SITE_OTHER): Payer: Medicare Other | Admitting: Internal Medicine

## 2022-10-22 VITALS — BP 169/98 | HR 66 | Temp 97.9°F | Ht 69.0 in | Wt 209.1 lb

## 2022-10-22 DIAGNOSIS — I10 Essential (primary) hypertension: Secondary | ICD-10-CM

## 2022-10-22 DIAGNOSIS — F411 Generalized anxiety disorder: Secondary | ICD-10-CM

## 2022-10-22 DIAGNOSIS — Z87891 Personal history of nicotine dependence: Secondary | ICD-10-CM

## 2022-10-22 NOTE — Assessment & Plan Note (Signed)
His blood pressure has remained elevated at this visit we discussed the option of starting antihypertensive therapy however he remains hesitant he would like to try to maximize nonpharmacologic therapies and also monitor his blood pressure at home for me.  We will schedule a closer follow-up of his blood pressure ideally in the next 2 to 3 months to make sure he is hitting goals

## 2022-10-22 NOTE — Assessment & Plan Note (Signed)
Propranolol was ineffective and made him feel off.  He has had a sparing use of Klonopin which has helped when he goes out and is around large crowds.

## 2022-10-22 NOTE — Progress Notes (Signed)
Established Patient Office Visit  Subjective   Patient ID: [name], male    DOB: [birth year]  Age: 56 y.o. MRN: [id number]  Chief Complaint  Patient presents with   Check-up Visit   [name] presents today for checkup about elevated blood pressure readings and anxiety.  His wife has had a major health issue which caused additional stress and anxiety and required a lot of his attention fortunately she is getting somewhat better however has not returned to work yet.  I did prescribe him a few pills of Klonopin for some situational anxiety he notes he is only taken 4-5 of these pills very sparingly which did help when he took it.  Overall he feels he is been doing well with anxiety control.  He admits that he has had less time for exercise and other wellness items like hibiscus tea which .he feels has controlled his blood pressure in the past  He notes he has an annual visit in January with the [place] where he plans to get lab work.     Objective:     BP (!) 169/98 (BP Location: Left Arm, Patient Position: Sitting, Cuff Size: Normal)   Pulse 66   Temp 97.9 F (36.6 C) (Oral)   Ht '5\' 9"'$  (1.753 m)   Wt 209 lb 1.6 oz (94.8 kg)   SpO2 99% Comment: RA  BMI 30.88 kg/m  BP Readings from Last 3 Encounters:  10/22/22 (!) 169/98  04/16/22 (!) 153/70  12/04/21 (!) 141/94   Wt Readings from Last 3 Encounters:  10/22/22 209 lb 1.6 oz (94.8 kg)  04/16/22 203 lb (92.1 kg)  12/04/21 195 lb (88.5 kg)      Physical Exam Vitals and nursing note reviewed.  Constitutional:      Appearance: Normal appearance.  Pulmonary:     Effort: Pulmonary effort is normal.  Neurological:     Mental Status: He is alert.  Psychiatric:        Mood and Affect: Mood normal.        Behavior: Behavior normal.      No results found for any visits on 10/22/22.  Last CBC Lab Results  Component Value Date   WBC 7.6 10/07/2021   HGB 14.9 10/07/2021   HCT 44.1 10/07/2021   MCV 91.1 10/07/2021   MCH  31.2 04/25/2020   RDW 13.9 10/07/2021   PLT 272.0 44/11/270   Last metabolic panel Lab Results  Component Value Date   GLUCOSE 102 (H) 10/07/2021   NA 138 10/07/2021   K 4.2 10/07/2021   CL 102 10/07/2021   CO2 28 10/07/2021   BUN 17 10/07/2021   CREATININE 0.87 10/07/2021   GFRNONAA >60 04/25/2020   CALCIUM 9.5 10/07/2021   PROT 7.4 10/07/2021   ALBUMIN 4.9 10/07/2021   BILITOT 0.5 10/07/2021   ALKPHOS 60 10/07/2021   AST 25 10/07/2021   ALT 30 10/07/2021   ANIONGAP 9 04/25/2020   Last lipids No results found for: "CHOL", "HDL", "LDLCALC", "LDLDIRECT", "TRIG", "CHOLHDL"    The 10-year ASCVD risk score (Arnett DK, et al., 2019) is: 15.3%* (Cholesterol units were assumed)    Assessment & Plan:   Problem List Items Addressed This Visit       Cardiovascular and Mediastinum   Essential hypertension - Primary (Chronic)    His blood pressure has remained elevated at this visit we discussed the option of starting antihypertensive therapy however he remains hesitant he would like to try to maximize nonpharmacologic  therapies and also monitor his blood pressure at home for me.  We will schedule a closer follow-up of his blood pressure ideally in the next 2 to 3 months to make sure he is hitting goals        Other   Generalized anxiety disorder    Propranolol was ineffective and made him feel off.  He has had a sparing use of Klonopin which has helped when he goes out and is around large crowds.       Return 3-6 months.    Lucious Groves, DO

## 2022-11-04 IMAGING — MR MR HEAD W/O CM
11 series · 48 of 48 positions shown · non-contrast
Comparison: None.

CLINICAL DATA: History of traumatic brain injury.

EXAM:
MRI HEAD WITHOUT CONTRAST
TECHNIQUE: Multiplanar, multiecho pulse sequences of the brain and surrounding
structures were obtained without intravenous contrast.

[Series 5: T1 · sagittal · 4.0mm · 0.94mm/px · 3 of 31 slices shown (1 of 2)]
[im 1/31]
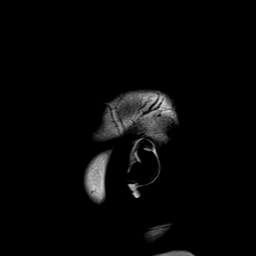
[im 16/31]
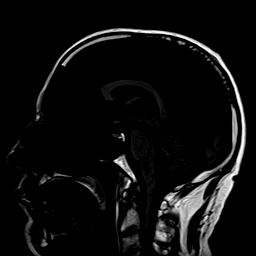
[im 31/31]
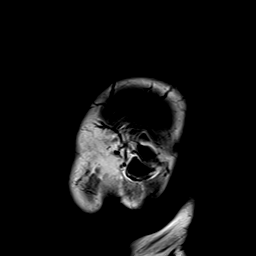

[Series 6: DWI · axial · 3.0mm · 0.94mm/px · z∈[-27,+109]mm · 10 of 160 slices shown (1 of 3)]
[im 1/160]
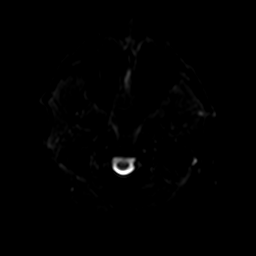
[im 18/160]
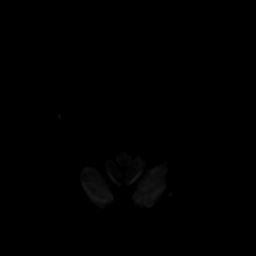
[im 36/160]
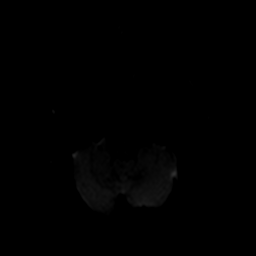
[im 54/160]
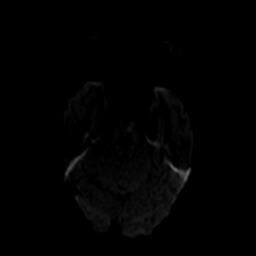
[im 71/160]
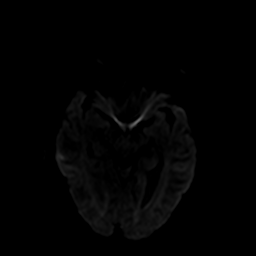
[im 89/160]
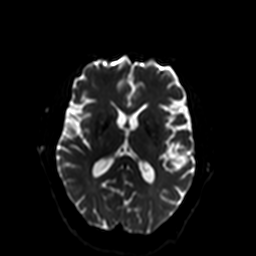
[im 107/160]
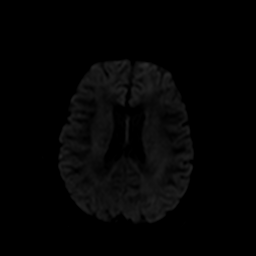
[im 124/160]
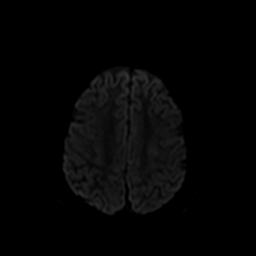
[im 142/160]
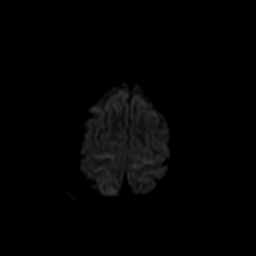
[im 160/160]
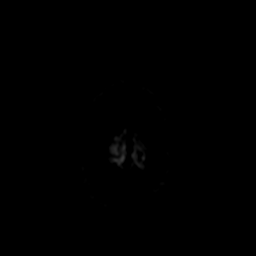

[Series 7: ax dwi_tracew · axial · 3.0mm · 0.94mm/px · z∈[-27,+109]mm · 5 of 80 slices shown]
[im 1/80]
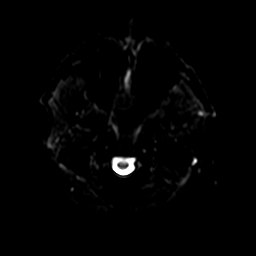
[im 20/80]
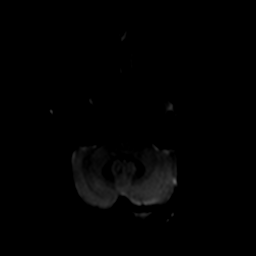
[im 40/80]
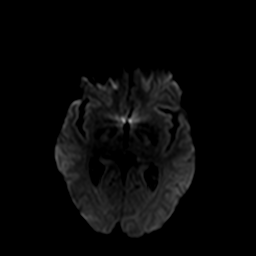
[im 60/80]
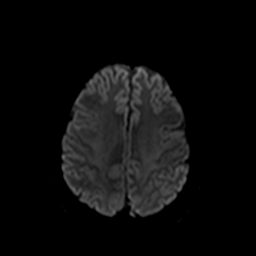
[im 80/80]
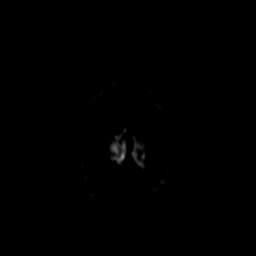

[Series 8: ax dwi_adc · axial · 3.0mm · 0.94mm/px · z∈[-27,+109]mm · 2 of 39 slices shown]
[im 1/39]
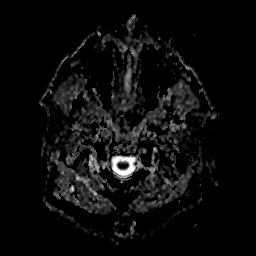
[im 39/39]
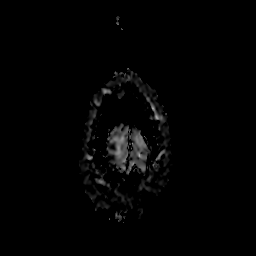

[Series 9: DWI · coronal · 5.0mm · 1.44mm/px · 4 of 68 slices shown (2 of 3)]
[im 1/68]
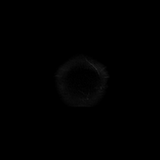
[im 23/68]
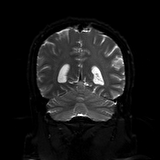
[im 45/68]
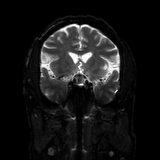
[im 68/68]
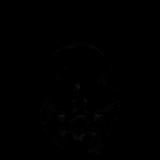

[Series 10: DWI · coronal · 5.0mm · 1.44mm/px · 2 of 34 slices shown (3 of 3)]
[im 1/34]
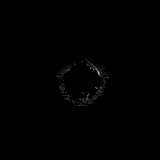
[im 34/34]
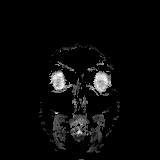

[Series 11: T2 · axial · 4.0mm · 0.36mm/px · z∈[-29,+129]mm · 2 of 33 slices shown (1 of 2)]
[im 1/33]
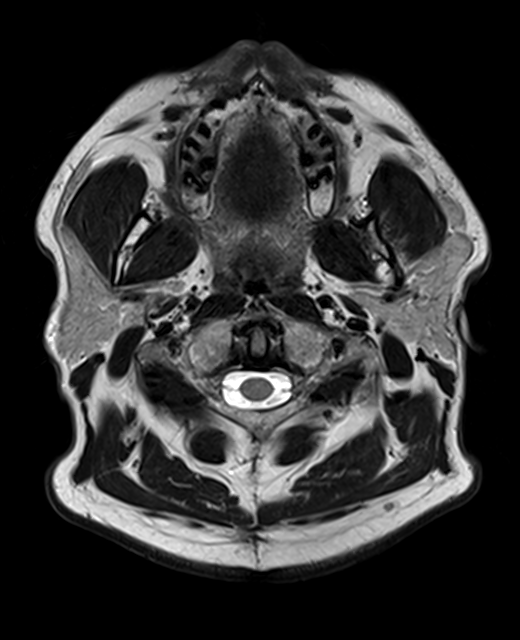
[im 33/33]
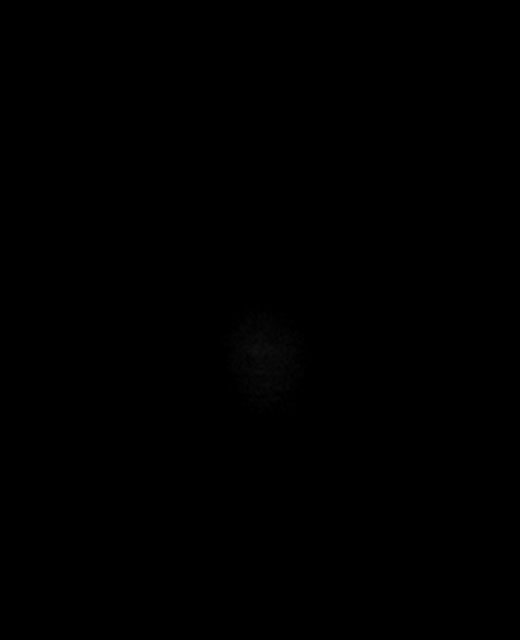

[Series 12: FLAIR · axial · 3.0mm · 0.72mm/px · z∈[-21,+122]mm · 2 of 26 slices shown]
[im 1/26]
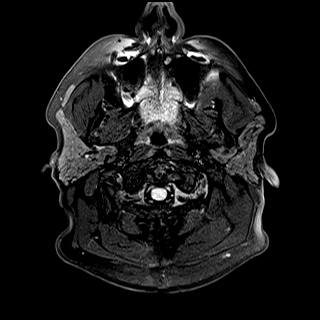
[im 26/26]
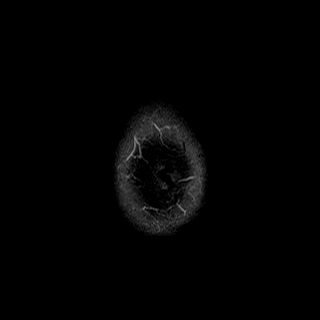

[Series 14: swi_images · axial · 1.5mm · 1.03mm/px · z∈[-18,+118]mm · 6 of 96 slices shown]
[im 1/96]
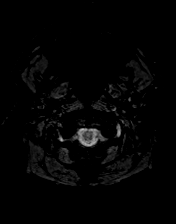
[im 20/96]
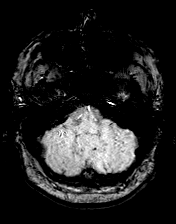
[im 39/96]
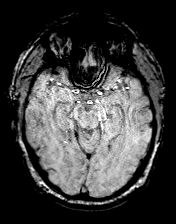
[im 58/96]
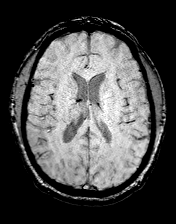
[im 77/96]
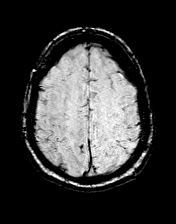
[im 96/96]
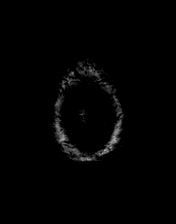

[Series 15: T1 · axial · 1.0mm · 1.07mm/px · z∈[-33,+122]mm · 10 of 160 slices shown (2 of 2)]
[im 1/160]
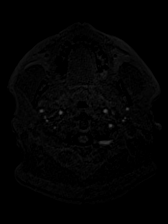
[im 18/160]
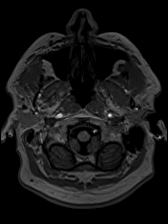
[im 36/160]
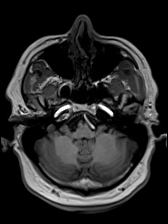
[im 54/160]
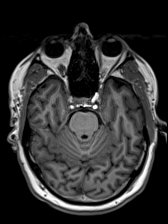
[im 71/160]
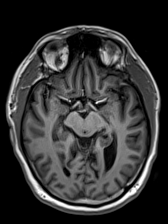
[im 89/160]
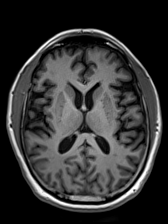
[im 107/160]
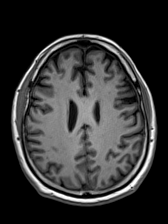
[im 124/160]
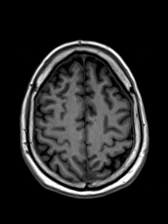
[im 142/160]
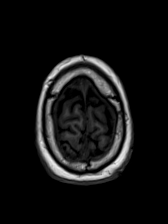
[im 160/160]
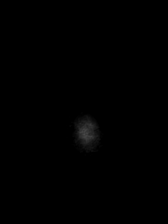

[Series 16: T2 · coronal · 4.5mm · 0.45mm/px · 2 of 33 slices shown (2 of 2)]
[im 1/33]
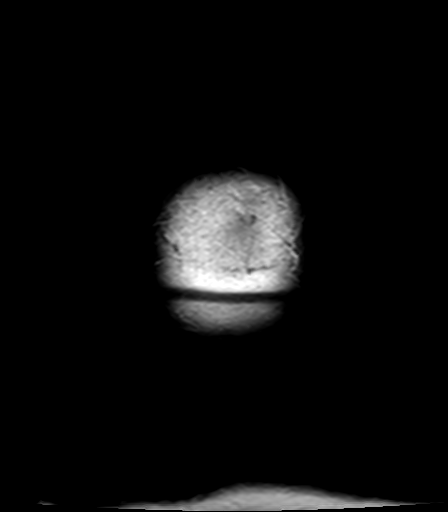
[im 33/33]
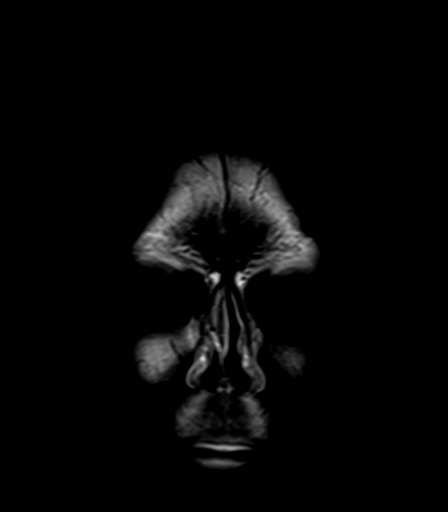

[48 of 48 positions shown; findings below may reference images not displayed]

FINDINGS: Brain: No acute infarct, mass effect or extra-axial collection. No
acute or chronic hemorrhage. Normal white matter signal, parenchymal
volume and CSF spaces. The midline structures are normal.

Vascular: Major flow voids are preserved.

Skull and upper cervical spine: Normal calvarium and skull base.
Visualized upper cervical spine and soft tissues are normal.

Sinuses/Orbits:No paranasal sinus fluid levels or advanced mucosal
thickening. No mastoid or middle ear effusion. Normal orbits.
IMPRESSION: Normal brain MRI.

## 2022-12-10 ENCOUNTER — Other Ambulatory Visit: Payer: Self-pay | Admitting: Internal Medicine

## 2022-12-10 ENCOUNTER — Ambulatory Visit (INDEPENDENT_AMBULATORY_CARE_PROVIDER_SITE_OTHER): Payer: Medicare PPO | Admitting: Internal Medicine

## 2022-12-10 ENCOUNTER — Other Ambulatory Visit: Payer: Self-pay

## 2022-12-10 ENCOUNTER — Encounter: Payer: Self-pay | Admitting: Internal Medicine

## 2022-12-10 VITALS — BP 150/90 | HR 75 | Temp 98.4°F | Resp 18 | Ht 69.0 in | Wt 208.5 lb

## 2022-12-10 DIAGNOSIS — I10 Essential (primary) hypertension: Secondary | ICD-10-CM

## 2022-12-10 DIAGNOSIS — Z87891 Personal history of nicotine dependence: Secondary | ICD-10-CM | POA: Diagnosis not present

## 2022-12-10 DIAGNOSIS — E782 Mixed hyperlipidemia: Secondary | ICD-10-CM | POA: Diagnosis not present

## 2022-12-10 DIAGNOSIS — Z1159 Encounter for screening for other viral diseases: Secondary | ICD-10-CM

## 2022-12-11 LAB — BMP8+ANION GAP
Anion Gap: 18 mmol/L (ref 10.0–18.0)
BUN/Creatinine Ratio: 16 (ref 9–20)
BUN: 13 mg/dL (ref 6–24)
CO2: 19 mmol/L — ABNORMAL LOW (ref 20–29)
Calcium: 9.3 mg/dL (ref 8.7–10.2)
Chloride: 103 mmol/L (ref 96–106)
Creatinine, Ser: 0.79 mg/dL (ref 0.76–1.27)
Glucose: 108 mg/dL — ABNORMAL HIGH (ref 70–99)
Potassium: 4.8 mmol/L (ref 3.5–5.2)
Sodium: 140 mmol/L (ref 134–144)
eGFR: 104 mL/min/{1.73_m2} (ref >59)

## 2022-12-11 LAB — CBC
Hematocrit: 44.2 % (ref 37.5–51.0)
Hemoglobin: 15.1 g/dL (ref 13.0–17.7)
MCH: 31 pg (ref 26.6–33.0)
MCHC: 34.2 g/dL (ref 31.5–35.7)
MCV: 91 fL (ref 79–97)
Platelets: 295 10*3/uL (ref 150–450)
RBC: 4.87 x10E6/uL (ref 4.14–5.80)
RDW: 13.2 % (ref 11.6–15.4)
WBC: 8.2 10*3/uL (ref 3.4–10.8)

## 2022-12-11 LAB — LIPID PANEL
Chol/HDL Ratio: 5.1 ratio — ABNORMAL HIGH (ref 0.0–5.0)
Cholesterol, Total: 247 mg/dL — ABNORMAL HIGH (ref 100–199)
HDL: 48 mg/dL (ref >39)
LDL Chol Calc (NIH): 153 mg/dL — ABNORMAL HIGH (ref 0–99)
Triglycerides: 252 mg/dL — ABNORMAL HIGH (ref 0–149)
VLDL Cholesterol Cal: 46 mg/dL — ABNORMAL HIGH (ref 5–40)

## 2022-12-11 LAB — HCV INTERPRETATION

## 2022-12-11 LAB — HCV AB W REFLEX TO QUANT PCR: HCV Ab: NONREACTIVE

## 2022-12-11 NOTE — Assessment & Plan Note (Signed)
I think that low-dose carvedilol is a good idea for his blood pressure and I told him this.  We can can see about converting him over to a once daily pill once he becomes on a stable dose.  He is follow-up with his [facility] in February and will try to get the records sent to me as well.

## 2022-12-11 NOTE — Progress Notes (Signed)
Established Patient Office Visit  Subjective   Patient ID: [name], male    DOB: Dec 05, 1965  Age: 57 y.o. MRN: [id number]  Chief Complaint  Patient presents with   Hypertension   [name] presented today with his wife, he wanted to be seen today to follow-up his blood pressure.  He reports he saw a new [place] doctor yesterday blood pressure remained elevated and he was prescribed carvedilol 3.125 instructed to take twice daily he has not yet picked up the medication but he wanted to go over things with me to make sure that I agree and also to repeat some of his lab work.  He is not having any symptoms of high blood pressure at this time he has had adverse side effects to multiple antihypertensive medications.  His wife and him do have some increased stress just because she continues to have episodes of vertigo and headaches following a brain tumor and has remained out of work    Objective:     BP (!) 150/90   Pulse 75   Temp 98.4 F (36.9 C)   Resp 18   Ht '5\' 9"'$  (1.753 m)   Wt 208 lb 8 oz (94.6 kg)   SpO2 97%   BMI 30.79 kg/m  BP Readings from Last 3 Encounters:  12/09/22 (!) 150/90  10/22/22 (!) 169/98  04/16/22 (!) 153/70      Physical Exam Vitals and nursing note reviewed.  Constitutional:      Appearance: Normal appearance.  Eyes:     Conjunctiva/sclera: Conjunctivae normal.  Pulmonary:     Effort: Pulmonary effort is normal.  Neurological:     Mental Status: He is alert.  Psychiatric:        Mood and Affect: Mood normal.        Behavior: Behavior normal.        Thought Content: Thought content normal.      Results for orders placed or performed in visit on 12/10/22  Hepatitis C Ab reflex to Quant PCR  Result Value Ref Range   HCV Ab Non Reactive Non Reactive  Lipid Profile  Result Value Ref Range   Cholesterol, Total 247 (H) 100 - 199 mg/dL   Triglycerides 252 (H) 0 - 149 mg/dL   HDL 48 >39 mg/dL   VLDL Cholesterol Cal 46 (H) 5 - 40 mg/dL   LDL  Chol Calc (NIH) 153 (H) 0 - 99 mg/dL   Chol/HDL Ratio 5.1 (H) 0.0 - 5.0 ratio  CBC no Diff  Result Value Ref Range   WBC 8.2 3.4 - 10.8 x10E3/uL   RBC 4.87 4.14 - 5.80 x10E6/uL   Hemoglobin 15.1 13.0 - 17.7 g/dL   Hematocrit 44.2 37.5 - 51.0 %   MCV 91 79 - 97 fL   MCH 31.0 26.6 - 33.0 pg   MCHC 34.2 31.5 - 35.7 g/dL   RDW 13.2 11.6 - 15.4 %   Platelets 295 150 - 450 x10E3/uL  BMP8+Anion Gap  Result Value Ref Range   Glucose 108 (H) 70 - 99 mg/dL   BUN 13 6 - 24 mg/dL   Creatinine, Ser 0.79 0.76 - 1.27 mg/dL   eGFR 104 >59 mL/min/1.73   BUN/Creatinine Ratio 16 9 - 20   Sodium 140 134 - 144 mmol/L   Potassium 4.8 3.5 - 5.2 mmol/L   Chloride 103 96 - 106 mmol/L   CO2 19 (L) 20 - 29 mmol/L   Anion Gap 18.0 10.0 - 18.0 mmol/L  Calcium 9.3 8.7 - 10.2 mg/dL  Interpretation:  Result Value Ref Range   HCV Interp 1: Comment     Last CBC Lab Results  Component Value Date   WBC 8.2 12/10/2022   HGB 15.1 12/10/2022   HCT 44.2 12/10/2022   MCV 91 12/10/2022   MCH 31.0 12/10/2022   RDW 13.2 12/10/2022   PLT 295 73/41/9379   Last metabolic panel Lab Results  Component Value Date   GLUCOSE 108 (H) 12/10/2022   NA 140 12/10/2022   K 4.8 12/10/2022   CL 103 12/10/2022   CO2 19 (L) 12/10/2022   BUN 13 12/10/2022   CREATININE 0.79 12/10/2022   EGFR 104 12/10/2022   CALCIUM 9.3 12/10/2022   PROT 7.4 10/07/2021   ALBUMIN 4.9 10/07/2021   BILITOT 0.5 10/07/2021   ALKPHOS 60 10/07/2021   AST 25 10/07/2021   ALT 30 10/07/2021   ANIONGAP 9 04/25/2020   Last lipids Lab Results  Component Value Date   CHOL 247 (H) 12/10/2022   HDL 48 12/10/2022   LDLCALC 153 (H) 12/10/2022   TRIG 252 (H) 12/10/2022   CHOLHDL 5.1 (H) 12/10/2022      The 10-year ASCVD risk score (Arnett DK, et al., 2019) is: 12.2%    Assessment & Plan:   Problem List Items Addressed This Visit       Cardiovascular and Mediastinum   Essential hypertension (Chronic)    I think that low-dose  carvedilol is a good idea for his blood pressure and I told him this.  We can can see about converting him over to a once daily pill once he becomes on a stable dose.  He is follow-up with his [facility] in February and will try to get the records sent to me as well.      Relevant Medications   carvedilol (COREG) 3.125 MG tablet     Other   Mixed hyperlipidemia - Primary    I have repeated his lipid panel.  This was nonfasting his LDL is a bit high putting his ASCVD risk score up to about 12% (actually think this is probably a little bit lower given he has not started his antihypertensive medication yet).  However he is indicated for a statin I talked him about this he anticipates some improved eating and lifestyle changes and would like to recheck in April I told him this is reasonable he tentatively agreed to start a medication if he is not able to get his cholesterol numbers down.      Relevant Medications   carvedilol (COREG) 3.125 MG tablet   Other Visit Diagnoses     Need for hepatitis C screening test           No follow-ups on file.    Lucious Groves, DO

## 2022-12-11 NOTE — Assessment & Plan Note (Signed)
I have repeated his lipid panel.  This was nonfasting his LDL is a bit high putting his ASCVD risk score up to about 12% (actually think this is probably a little bit lower given he has not started his antihypertensive medication yet).  However he is indicated for a statin I talked him about this he anticipates some improved eating and lifestyle changes and would like to recheck in April I told him this is reasonable he tentatively agreed to start a medication if he is not able to get his cholesterol numbers down.

## 2023-01-20 ENCOUNTER — Telehealth: Payer: Self-pay | Admitting: *Deleted

## 2023-01-20 NOTE — Telephone Encounter (Signed)
Call from patient states went to have a Stress Test done this morning B/P was 170/90.   Has been having some pain around a level 8.  Wanted to know if he should be on something more than Carvedilol.   Patient states he does take his blood pressures at home and they are around 134-140 over 80.   Patient also stated that he will be having hand surgery soon as well.  Will request that the visit today be sent to the Clinics as well.

## 2023-01-20 NOTE — Telephone Encounter (Signed)
Called him back, he will continue to monitor his blood pressure over the next week and report back as needed, overall discussed his goal would be <130/80 but I am not too concerned about a single elevated pressure, and we have plenty of room to uptitrate his coreg dose as needed.

## 2023-03-11 ENCOUNTER — Ambulatory Visit: Payer: No Typology Code available for payment source

## 2023-03-11 ENCOUNTER — Encounter: Payer: No Typology Code available for payment source | Admitting: Internal Medicine

## 2023-04-20 DIAGNOSIS — L300 Nummular dermatitis: Secondary | ICD-10-CM | POA: Diagnosis not present

## 2023-04-20 DIAGNOSIS — L718 Other rosacea: Secondary | ICD-10-CM | POA: Diagnosis not present

## 2023-04-22 ENCOUNTER — Ambulatory Visit (INDEPENDENT_AMBULATORY_CARE_PROVIDER_SITE_OTHER): Payer: Medicare PPO | Admitting: Internal Medicine

## 2023-04-22 DIAGNOSIS — I10 Essential (primary) hypertension: Secondary | ICD-10-CM

## 2023-04-22 DIAGNOSIS — J309 Allergic rhinitis, unspecified: Secondary | ICD-10-CM | POA: Diagnosis not present

## 2023-04-22 DIAGNOSIS — F411 Generalized anxiety disorder: Secondary | ICD-10-CM

## 2023-04-22 NOTE — Assessment & Plan Note (Signed)
Notes very rare use on klonopin, overall doing very well, still has 5 pill from last Rx (a few months ago)

## 2023-04-22 NOTE — Assessment & Plan Note (Signed)
Continue claritin in AM, singular in PM, flonase daily

## 2023-04-22 NOTE — Assessment & Plan Note (Signed)
Well controlled continue Coreg 3.125 BID

## 2023-04-22 NOTE — Progress Notes (Signed)
  [facility] Residency Telephone Encounter Continuity Care Appointment  HPI:  This telephone encounter was created for [name] on 04/22/2023 for the following purpose/cc HTN, HLD, OA He saw his [place] doctor (dr Dareen Piano) earlier this month. BP is actually much improved on Coreg. BP at that check was 128/62. Had his cholesterol checked I can't seem to see those numbers but chris reports he was told the numbers were good.  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  He was also started on singular and flonase or allergies/ rhinnitis   Past Medical History:  Past Medical History:  Diagnosis Date   Anxiety    ptsd   Arthritis    Depression    Family history of adverse reaction to anesthesia    Grandfather "woke up confused"?    Headache    Heart murmur    "years ago" ; denies symptom    Hypertension    Left ear hearing loss    Low back pain    PONV (postoperative nausea and vomiting)    Radicular leg pain    Intermittent Right leg pain   Sleep apnea      ROS:  No orthostasis, fatigue + for runny nose occassionally + occasional anxiety but better   Assessment / Plan / Recommendations:  Please see A&P under problem oriented charting for assessment of the patient's acute and chronic medical conditions.  As always, pt is advised that if symptoms worsen or new symptoms arise, they should go to an urgent care facility or to to ER for further evaluation.   Consent and Medical Decision Making:   This is a telephone encounter between Glade Stanford and Gust Rung on 04/22/2023 for HTN, HLD. The visit was conducted with the patient located at home and Gust Rung at [facility]. The patient's identity was confirmed using their DOB and current address. The patient has consented to being evaluated through a telephone encounter and understands the associated risks (an examination cannot be done and the patient may  need to come in for an appointment) / benefits (allows the patient to remain at home, decreasing exposure to coronavirus). I personally spent 12 minutes on medical discussion.    Essential hypertension Assessment & Plan: Well controlled continue Coreg 3.125 BID   Allergic rhinitis, unspecified seasonality, unspecified trigger Assessment & Plan: Continue claritin in AM, singular in PM, flonase daily   Generalized anxiety disorder Assessment & Plan: Notes very rare use on klonopin, overall doing very well, still has 5 pill from last Rx (a few months ago)

## 2023-09-08 ENCOUNTER — Ambulatory Visit: Payer: Medicare PPO

## 2023-09-08 VITALS — Ht 69.0 in | Wt 208.0 lb

## 2023-09-08 DIAGNOSIS — Z0000 Encounter for general adult medical examination without abnormal findings: Secondary | ICD-10-CM | POA: Diagnosis not present

## 2023-09-08 NOTE — Progress Notes (Signed)
Subjective:   [name] is a 57 y.o. male who presents for an Initial Medicare Annual Wellness Visit.  Visit Complete: Virtual I connected with  Glade Stanford on 09/08/23 by a audio enabled telemedicine application and verified that I am speaking with the correct person using two identifiers.  Patient Location: Home  Provider Location: Home Office  I discussed the limitations of evaluation and management by telemedicine. The patient expressed understanding and agreed to proceed.  Vital Signs: Because this visit was a virtual/telehealth visit, some criteria may be missing or patient reported. Any vitals not documented were not able to be obtained and vitals that have been documented are patient reported.    Cardiac Risk Factors include: male gender;advanced age (>36men, >57 women);hypertension;dyslipidemia     Objective:    Today's Vitals   09/08/23 0853 09/08/23 0854  Weight: 208 lb (94.3 kg)   Height: 5\' 9"  (1.753 m)   PainSc:  5    Body mass index is 30.72 kg/m.     09/08/2023    9:03 AM 12/10/2022   12:25 PM 10/22/2022    9:23 AM 04/16/2022    9:01 AM 12/04/2021   10:06 AM 03/12/2021   11:01 AM 12/23/2020   12:38 PM  Advanced Directives  Does Patient Have a Medical Advance Directive? Yes Yes Yes No Yes No Yes  Type of Estate agent of [place];Living will Healthcare Power of [place];Living will Healthcare Power of [place];Living will  Healthcare Power of [place];Living will    Does patient want to make changes to medical advance directive? No - Patient declined No - Patient declined No - Patient declined      Copy of Healthcare Power of Attorney in Chart? Yes - validated most recent copy scanned in chart (See row information) Yes - validated most recent copy scanned in chart (See row information) Yes - validated most recent copy scanned in chart (See row information)  Yes - validated most recent copy scanned in chart (See row  information)    Would patient like information on creating a medical advance directive?    No - Patient declined       Current Medications (verified) Outpatient Encounter Medications as of 09/08/2023  Medication Sig   azelastine (ASTELIN) 0.1 % nasal spray Place into both nostrils daily as needed for allergies. Use in each nostril as directed   carvedilol (COREG) 3.125 MG tablet Take 3.125 mg by mouth 2 (two) times daily with a meal.   fluticasone (VERAMYST) 27.5 MCG/SPRAY nasal spray Place 2 sprays into the nose daily.   lidocaine (XYLOCAINE) 5 % ointment Apply 1 application topically every 6 (six) hours as needed for moderate pain (KNEE PAIN).   loratadine (CLARITIN) 10 MG tablet Take 1 tablet by mouth daily as needed for allergies.   montelukast (SINGULAIR) 10 MG tablet Take 10 mg by mouth at bedtime.   clonazePAM (KLONOPIN) 0.5 MG tablet Take 1 tablet (0.5 mg total) by mouth 2 (two) times daily as needed for anxiety.   No facility-administered encounter medications on file as of 09/08/2023.    Allergies (verified) Lisinopril, Tramadol, Amlodipine, Gabapentin, and Venlafaxine   History: Past Medical History:  Diagnosis Date   Anxiety    ptsd   Arthritis    Depression    Family history of adverse reaction to anesthesia    Grandfather "woke up confused"?    Headache    Heart murmur    "years ago" ; denies symptom  Hypertension    Left ear hearing loss    Low back pain    PONV (postoperative nausea and vomiting)    Radicular leg pain    Intermittent Right leg pain   Sleep apnea    Past Surgical History:  Procedure Laterality Date   ABDOMINAL EXPOSURE N/A 05/01/2020   Procedure: ABDOMINAL EXPOSURE;  Surgeon: Larina Earthly, MD;  Location: [facility] OR;  Service: Vascular;  Laterality: N/A;   APPENDECTOMY  11/23/2017   BACK SURGERY     BILATERAL KNEE ARTHROSCOPY     BIOPSY  12/04/2021   Procedure: BIOPSY;  Surgeon: Sherrilyn Rist, MD;  Location: [facility] ENDOSCOPY;  Service:  Gastroenterology;;   c5-6     removal of fusion and spacers   CERVICAL SPINE SURGERY     2015,OCTOBER   COLONOSCOPY     COLONOSCOPY WITH PROPOFOL N/A 12/04/2021   Procedure: COLONOSCOPY WITH PROPOFOL;  Surgeon: Sherrilyn Rist, MD;  Location: [facility];  Service: Gastroenterology;  Laterality: N/A;   ESOPHAGOGASTRODUODENOSCOPY (EGD) WITH PROPOFOL N/A 12/04/2021   Procedure: ESOPHAGOGASTRODUODENOSCOPY (EGD) WITH PROPOFOL;  Surgeon: Sherrilyn Rist, MD;  Location: [facility];  Service: Gastroenterology;  Laterality: N/A;   IR FLUORO GUIDED NEEDLE PLC ASPIRATION/INJECTION LOC  12/19/2019   LUMBAR LAMINECTOMY/DECOMPRESSION MICRODISCECTOMY N/A 07/14/2017   Procedure: CENTRAL MICROLUMBAR DECOMPRESSION OF L3-L4 AND L4-L5,  AND EXCISION OF LIPOMA;  Surgeon: Jene Every, MD;  Location: [facility] ORS;  Service: Orthopedics;  Laterality: N/A;   ORTHOPAEDIC SURGERY  06/15/2019   POLYPECTOMY  12/04/2021   Procedure: POLYPECTOMY;  Surgeon: Sherrilyn Rist, MD;  Location: [facility];  Service: Gastroenterology;;   Family History  Problem Relation Age of Onset   Hepatitis Mother    Lung cancer Mother    Colon cancer Neg Hx    Cancer - Colon Neg Hx    Rectal cancer Neg Hx    Liver cancer Neg Hx    Stomach cancer Neg Hx    Pancreatic cancer Neg Hx    Social History   Socioeconomic History   Marital status: Married    Spouse name: Victorino Dike   Number of children: 0   Years of education: Not on file   Highest education level: Not on file  Occupational History    Comment: No longer working  Tobacco Use   Smoking status: Former    Current packs/day: 0.00    Types: Cigarettes    Quit date: 12/24/2012    Years since quitting: 10.7   Smokeless tobacco: Never  Vaping Use   Vaping status: Never Used  Substance and Sexual Activity   Alcohol use: Yes    Alcohol/week: 14.0 standard drinks of alcohol    Types: 7 Glasses of wine, 7 Cans of beer per week    Comment: Occasionally   Drug use: Yes     Types: Other-see comments, Marijuana    Comment: Cannnabis - gummie bears.   Sexual activity: Yes  Other Topics Concern   Not on file  Social History Narrative   Right handed   3 story home   Drinks caffeine   4 cats   Lives with wife.   Social Determinants of Health   Financial Resource Strain: Low Risk  (09/08/2023)   Overall Financial Resource Strain (CARDIA)    Difficulty of Paying Living Expenses: Not hard at all  Food Insecurity: No Food Insecurity (09/08/2023)   Hunger Vital Sign    Worried About Programme researcher, broadcasting/film/video in  the Last Year: Never true    Ran Out of Food in the Last Year: Never true  Transportation Needs: No Transportation Needs (09/08/2023)   PRAPARE - Administrator, Civil Service (Medical): No    Lack of Transportation (Non-Medical): No  Physical Activity: Sufficiently Active (09/08/2023)   Exercise Vital Sign    Days of Exercise per Week: 7 days    Minutes of Exercise per Session: 40 min  Stress: Not on file  Social Connections: Socially Isolated (09/08/2023)   Social Connection and Isolation Panel [NHANES]    Frequency of Communication with Friends and Family: Once a week    Frequency of Social Gatherings with Friends and Family: Once a week    Attends Religious Services: Never    Database administrator or Organizations: No    Attends Engineer, structural: Never    Marital Status: Married    Tobacco Counseling Counseling given: Not Answered   Clinical Intake:  Pre-visit preparation completed: Yes  Pain : 0-10 Pain Score: 5  Pain Type: Acute pain Pain Location:  (Neck, Back, hips) Pain Onset: More than a month ago Pain Frequency: Constant Pain Relieving Factors: THC gummies-Cannabis  Pain Relieving Factors: THC gummies-Cannabis  BMI - recorded: 30.72 Nutritional Status: BMI > 30  Obese Nutritional Risks: None Diabetes: No  How often do you need to have someone help you when you read instructions, pamphlets, or  other written materials from your doctor or pharmacy?: 1 - Never  Interpreter Needed?: No  Information entered by :: Randell Teare, RMA   Activities of Daily Living    09/08/2023    8:58 AM 12/10/2022   12:26 PM  In your present state of health, do you have any difficulty performing the following activities:  Hearing? 1 1  Vision? 0 0  Difficulty concentrating or making decisions? 0 0  Walking or climbing stairs? 1 1  Dressing or bathing? 0 0  Doing errands, shopping? 0 0  Preparing Food and eating ? N   Using the Toilet? N   In the past six months, have you accidently leaked urine? N   Do you have problems with loss of bowel control? N   Managing your Medications? N   Managing your Finances? N   Housekeeping or managing your Housekeeping? N     Patient Care Team: Gust Rung, DO as PCP - General (Internal Medicine) Dr. Leonette Monarch (Ophthalmology)  Indicate any recent Medical Services you may have received from other than [facility] providers in the past year (date may be approximate).     Assessment:   This is a routine wellness examination for [place].  Hearing/Vision screen Hearing Screening - Comments:: Lt ear hearing lost Vision Screening - Comments:: Denies vision issues.   Goals Addressed               This Visit's Progress     Patient Stated (pt-stated)        Keep enjoying life.      Depression Screen    09/08/2023    9:09 AM 12/10/2022   12:25 PM 10/22/2022    9:22 AM 04/16/2022   10:03 AM  PHQ 2/9 Scores  PHQ - 2 Score 0 0 0 1  PHQ- 9 Score 1       Fall Risk    09/08/2023    9:03 AM 12/10/2022   12:25 PM 10/22/2022    9:22 AM 04/16/2022    9:01 AM 03/12/2021  11:01 AM  Fall Risk   Falls in the past year? 1 1 1 1 1   Number falls in past yr: 1 0 0 1 1  Comment 3-4      Injury with Fall? 0 0 0 0 0  Risk for fall due to :  History of fall(s)  History of fall(s)   Follow up Falls evaluation completed;Falls prevention discussed Falls  evaluation completed;Falls prevention discussed Falls evaluation completed;Falls prevention discussed Falls evaluation completed;Falls prevention discussed     MEDICARE RISK AT HOME: Medicare Risk at Home Any stairs in or around the home?: Yes If so, are there any without handrails?: Yes Home free of loose throw rugs in walkways, pet beds, electrical cords, etc?: Yes Adequate lighting in your home to reduce risk of falls?: Yes Life alert?: No Use of a cane, walker or w/c?: Yes (ues a walking stick) Grab bars in the bathroom?: Yes Shower chair or bench in shower?: Yes Elevated toilet seat or a handicapped toilet?: Yes  TIMED UP AND GO:  Was the test performed? No    Cognitive Function:        09/08/2023    9:05 AM  6CIT Screen  What Year? 0 points  What month? 0 points  What time? 0 points  Count back from 20 0 points  Months in reverse 0 points  Repeat phrase 0 points  Total Score 0 points    Immunizations Immunization History  Administered Date(s) Administered   Influenza-Unspecified 12/30/2005, 10/07/2006, 09/30/2007, 08/26/2008, 08/23/2009, 09/16/2009, 08/15/2010   Tdap 02/07/2015    TDAP status: Up to date  Flu Vaccine status: Declined, Education has been provided regarding the importance of this vaccine but patient still declined. Advised may receive this vaccine at local pharmacy or Health Dept. Aware to provide a copy of the vaccination record if obtained from local pharmacy or Health Dept. Verbalized acceptance and understanding.   Covid-19 vaccine status: Declined, Education has been provided regarding the importance of this vaccine but patient still declined. Advised may receive this vaccine at local pharmacy or Health Dept.or vaccine clinic. Aware to provide a copy of the vaccination record if obtained from local pharmacy or Health Dept. Verbalized acceptance and understanding.  Qualifies for Shingles Vaccine? Yes   Zostavax completed No   Shingrix  Completed?: No.    Education has been provided regarding the importance of this vaccine. Patient has been advised to call insurance company to determine out of pocket expense if they have not yet received this vaccine. Advised may also receive vaccine at local pharmacy or Health Dept. Verbalized acceptance and understanding.  Screening Tests Health Maintenance  Topic Date Due   HIV Screening  Never done   Zoster Vaccines- Shingrix (1 of 2) Never done   INFLUENZA VACCINE  06/24/2023   COVID-19 Vaccine (1 - 2023-24 season) Never done   Medicare Annual Wellness (AWV)  09/07/2024   DTaP/Tdap/Td (2 - Td or Tdap) 02/06/2025   Colonoscopy  12/04/2026   Hepatitis C Screening  Completed   HPV VACCINES  Aged Out    Health Maintenance  Health Maintenance Due  Topic Date Due   HIV Screening  Never done   Zoster Vaccines- Shingrix (1 of 2) Never done   INFLUENZA VACCINE  06/24/2023   COVID-19 Vaccine (1 - 2023-24 season) Never done    Colorectal cancer screening: Type of screening: Colonoscopy. Completed 12/04/2021. Repeat every 5 years  Lung Cancer Screening: (Low Dose CT Chest recommended if Age 84-80 years,  20 pack-year currently smoking OR have quit w/in 15years.) does not qualify.   Lung Cancer Screening Referral: n/a  Additional Screening:  Hepatitis C Screening: does qualify; Completed 12/10/2022  Vision Screening: Recommended annual ophthalmology exams for early detection of glaucoma and other disorders of the eye. Is the patient up to date with their annual eye exam?  Yes  Who is the provider or what is the name of the office in which the patient attends annual eye exams? Dr. Leonette Monarch [facility], [place]) If pt is not established with a provider, would they like to be referred to a provider to establish care? No .   Dental Screening: Recommended annual dental exams for proper oral hygiene   Community Resource Referral / Chronic Care Management: CRR required this visit?  No   CCM  required this visit?  No    Plan:     I have personally reviewed and noted the following in the patient's chart:   Medical and social history Use of alcohol, tobacco or illicit drugs  Current medications and supplements including opioid prescriptions. Patient is not currently taking opioid prescriptions. Functional ability and status Nutritional status Physical activity Advanced directives List of other physicians Hospitalizations, surgeries, and ER visits in previous 12 months Vitals Screenings to include cognitive, depression, and falls Referrals and appointments  In addition, I have reviewed and discussed with patient certain preventive protocols, quality metrics, and best practice recommendations. A written personalized care plan for preventive services as well as general preventive health recommendations were provided to patient.     Leenah Seidner L Novah Nessel, CMA   09/08/2023   After Visit Summary: (MyChart) Due to this being a telephonic visit, the after visit summary with patients personalized plan was offered to patient via MyChart   Nurse Notes: Patient declines all vaccines.  He is up to date on all other health maintenance.  Patient had no other concerns to address today.

## 2023-09-08 NOTE — Patient Instructions (Signed)
[name] , Thank you for taking time to come for your Medicare Wellness Visit. I appreciate your ongoing commitment to your health goals. Please review the following plan we discussed and let me know if I can assist you in the future.   Referrals/Orders/Follow-Ups/Clinician Recommendations: It was nice speaking with you today.  Keep up the good work.  This is a list of the screening recommended for you and due dates:  Health Maintenance  Topic Date Due   HIV Screening  Never done   Zoster (Shingles) Vaccine (1 of 2) Never done   Flu Shot  06/24/2023   COVID-19 Vaccine (1 - 2023-24 season) Never done   Medicare Annual Wellness Visit  09/07/2024   DTaP/Tdap/Td vaccine (2 - Td or Tdap) 02/06/2025   Colon Cancer Screening  12/04/2026   Hepatitis C Screening  Completed   HPV Vaccine  Aged Out    Advanced directives: (Copy Requested) Please bring a copy of your health care power of attorney and living will to the office to be added to your chart at your convenience.  Next Medicare Annual Wellness Visit scheduled for next year: Yes

## 2023-09-10 NOTE — Progress Notes (Signed)
AWV reviewed and approved.  I was available in office to respond to any point of care inquiries.

## 2023-10-26 ENCOUNTER — Telehealth: Payer: Self-pay

## 2023-10-26 MED ORDER — AMOXICILLIN-POT CLAVULANATE 875-125 MG PO TABS: 1.0000 | ORAL_TABLET | Freq: Two times a day (BID) | ORAL | 0 refills | Status: AC

## 2023-10-26 NOTE — Telephone Encounter (Signed)
Call to patient to inform him that an Antibiotic has been called in for him.  Patient advised that if no improvement to give the Clinics a call.  Voiced understanding of.

## 2023-10-26 NOTE — Telephone Encounter (Signed)
Spoke with patient-has a Sinus infection.  Would like t get an Antibiotic for.  Stated is a yearly occurrence.  No fever.  Has green to yellowish mucous from nose for 5-6 days.  No coughing. Does have pressure in Sinus area .  Lives in [place].  Would like something called to the [facility] at [phone]

## 2023-10-26 NOTE — Telephone Encounter (Signed)
Requesting to speak with a nurse about sinus infection, would like medication for this. Please call pt back.

## 2023-10-26 NOTE — Telephone Encounter (Signed)
[name] will you let him know I have sent in the antibiotic, let me know if it does not improve as expected.

## 2023-12-28 DIAGNOSIS — L718 Other rosacea: Secondary | ICD-10-CM | POA: Diagnosis not present

## 2023-12-28 DIAGNOSIS — L300 Nummular dermatitis: Secondary | ICD-10-CM | POA: Diagnosis not present

## 2024-02-14 ENCOUNTER — Ambulatory Visit: Payer: Self-pay

## 2024-02-14 ENCOUNTER — Ambulatory Visit: Payer: Self-pay | Admitting: *Deleted

## 2024-02-14 NOTE — Telephone Encounter (Signed)
 Call could not be completed as dialed. Attempt 2

## 2024-02-14 NOTE — Telephone Encounter (Signed)
 Reason for Disposition  [1] Using nasal washes and pain medicine > 24 hours AND [2] sinus pain (around cheekbone or eye) persists  Answer Assessment - Initial Assessment Questions 1. LOCATION: "Where does it hurt?"      I'm having right sided sinus congestion and my right ear with pain and pressure.   My throat is scratchy from the drainage.   My ears are popping. 2. ONSET: "When did the sinus pain start?"  (e.g., hours, days)      4-5 days ago started 3. SEVERITY: "How bad is the pain?"   (Scale 1-10; mild, moderate or severe)   - MILD (1-3): doesn't interfere with normal activities    - MODERATE (4-7): interferes with normal activities (e.g., work or school) or awakens from sleep   - SEVERE (8-10): excruciating pain and patient unable to do any normal activities        No headaches just the pressure 4. RECURRENT SYMPTOM: "Have you ever had sinus problems before?" If Yes, ask: "When was the last time?" and "What happened that time?"      Yes I get these twice a year. 5. NASAL CONGESTION: "Is the nose blocked?" If Yes, ask: "Can you open it or must you breathe through your mouth?"     Yes   blocked 6. NASAL DISCHARGE: "Do you have discharge from your nose?" If so ask, "What color?"     No   It's stopped up   using Navage but it's not working 7. FEVER: "Do you have a fever?" If Yes, ask: "What is it, how was it measured, and when did it start?"      No 8. OTHER SYMPTOMS: "Do you have any other symptoms?" (e.g., sore throat, cough, earache, difficulty breathing)     Scratchy throat, pressure in my cheek area. 9. PREGNANCY: "Is there any chance you are pregnant?" "When was your last menstrual period?"     N/A  Protocols used: Sinus Pain or Congestion-A-AH  Chief Complaint: Sinus congestion and ear pain and pressure on right side of head. Symptoms: Nasal congestion and ear pressure on right side that started 4-5 days ago. Frequency: Over the last 4-5 days Pertinent Negatives: Patient  denies fever Disposition: [] ED /[] Urgent Care (no appt availability in office) / [] Appointment(In office/virtual)/ []  [facility]/ [] Home Care/ [] Refused Recommended Disposition /[] [place] [facility]/ [x]  Follow-up with PCP Additional Notes: Pt lives in [place] and insisting Dr. Mikey Bussing will just call me in antibiotics and steroids.   He does that for me all the time.   Refused to make an appt.   "I don't feel like driving there for this".    "Just let him know what is going on and he always just calls in what I need".

## 2024-02-14 NOTE — Telephone Encounter (Signed)
 1st attempt, "call cannot be completed as dialed" message.     Summary: Question sinus infection   Copied From CRM [id number]. Reason for Triage: stuffiness, ear popping, yellow green phlegm. Question sinus infection

## 2024-02-14 NOTE — Telephone Encounter (Signed)
 Patient called reporting "stuffiness, ear popping, and yellow green phlegm." This RN made a 3rd attempt to call the patient for triage and scheduling. RN received the message "call cannot be completed as dialed." RN unable to LVM.  Please follow-up with the patient.   Reason for Disposition  Third attempt to contact caller AND no contact made. Phone number verified.  Protocols used: No Contact or Duplicate Contact Call-A-AH

## 2024-02-14 NOTE — Telephone Encounter (Signed)
 RTC to patient.  Message left for patient to call the Clinics to be scheduled for an appointment.

## 2024-02-15 MED ORDER — AMOXICILLIN-POT CLAVULANATE 875-125 MG PO TABS: 1.0000 | ORAL_TABLET | Freq: Two times a day (BID) | ORAL | 0 refills | Status: AC

## 2024-02-15 NOTE — Telephone Encounter (Signed)
 RTC from patient states he feels he may have a Sinus infection gets them a couple of times a year. Is blowing out yellow mucous.  Some pain behind the ears  No sore throat or fever at this time.  States has had Dr. Mikey Bussing call in something to his pharmacy as he lives in [place].

## 2024-02-15 NOTE — Telephone Encounter (Signed)
 Called back, having typical sinus infection symptoms as noted, no fevers.  Will rx augmentin x 7 days, will let me know if not improving.  Getting e prescibe error so sent to preferred pharmacy by fax

## 2024-02-16 NOTE — Telephone Encounter (Signed)
 Copied from CRM [id number]. Topic: Clinical - Prescription Issue >> Feb 16, 2024  1:12 PM Antony Haste wrote: Reason for CRM: The patient contacted [facility] to check on the refill request placed yesterday for Augmentin. He was advised by his pharmacy an refill order had not been received yet. The patient is requesting to have this sent to his pharmacy as soon as possible for his sinus infection.  Callback [phone]

## 2024-02-16 NOTE — Telephone Encounter (Signed)
 Call to Patient and Pharmacy prescription not received via fax.  Prescription called in for Augmentin 875-125 mg # 14 tablets. Take 1 tablet 2 times daily.  No refills.

## 2024-05-01 ENCOUNTER — Ambulatory Visit (INDEPENDENT_AMBULATORY_CARE_PROVIDER_SITE_OTHER): Admitting: Internal Medicine

## 2024-05-01 ENCOUNTER — Other Ambulatory Visit: Payer: Self-pay | Admitting: Internal Medicine

## 2024-05-01 ENCOUNTER — Encounter: Payer: Self-pay | Admitting: Internal Medicine

## 2024-05-01 VITALS — BP 143/73 | HR 66 | Temp 98.1°F | Ht 69.0 in | Wt 210.0 lb

## 2024-05-01 DIAGNOSIS — I10 Essential (primary) hypertension: Secondary | ICD-10-CM

## 2024-05-01 DIAGNOSIS — J309 Allergic rhinitis, unspecified: Secondary | ICD-10-CM | POA: Diagnosis not present

## 2024-05-01 DIAGNOSIS — F411 Generalized anxiety disorder: Secondary | ICD-10-CM | POA: Diagnosis not present

## 2024-05-01 DIAGNOSIS — E782 Mixed hyperlipidemia: Secondary | ICD-10-CM

## 2024-05-01 DIAGNOSIS — R7401 Elevation of levels of liver transaminase levels: Secondary | ICD-10-CM

## 2024-05-01 MED ORDER — CLONAZEPAM 0.5 MG PO TABS: 0.5000 mg | ORAL_TABLET | Freq: Two times a day (BID) | ORAL | 0 refills | Status: AC | PRN

## 2024-05-05 DIAGNOSIS — R7401 Elevation of levels of liver transaminase levels: Secondary | ICD-10-CM | POA: Insufficient documentation

## 2024-05-05 NOTE — Assessment & Plan Note (Signed)
 Mildly elevated at 57, does not drink etoh for last few years, will plan to recheck with next labwork. If persistently elevated will recheck viral serologies and US  for fatty liver.

## 2024-05-05 NOTE — Assessment & Plan Note (Signed)
 Very mildly elevated here but reassured that was at goal at recent visit.  Continue Coreg 3.125 BID

## 2024-05-05 NOTE — Assessment & Plan Note (Signed)
 Discussed use on OTC antihistamines and intransal corticosteroids.

## 2024-05-05 NOTE — Progress Notes (Signed)
 Established Patient Office Visit  Subjective   Patient ID: [name], male    DOB: 1966/10/27  Age: 58 y.o. MRN: [id number]  Chief Complaint  Patient presents with   Medical Management of Chronic Issues   [name] is here today for follow-up of chronic medical conditions including hypertension hyperlipidemia.  He [facility] his [facility] showing that [facility] was well-[facility] and he had recent labs.  Recent labs are grossly unremarkable except for very mildly elevated ALT.  Glucose was very mildly elevated but unclear if fasting.  DL mildly elevated at 409.  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  But he is managing well specially insofar as helping his wife with her current medical condition.  He did request a refill of Klonopin  as last fill of Klonopin -tablets lasted about 6 months.  Reports only takes it if particularly anxious sometimes this is surrounding his wife's medical visits.     Objective:     BP (!) 143/73 (BP Location: Left Arm, Patient Position: Sitting, Cuff Size: Large)   Pulse 66   Temp 98.1 F (36.7 C) (Oral)   Ht 5' 9 (1.753 m)   Wt 210 lb (95.3 kg)   SpO2 99% Comment: RA  BMI 31.01 kg/m  BP Readings from Last 3 Encounters:  05/01/24 (!) 143/73  12/09/22 (!) 150/90  10/22/22 (!) 169/98   Wt Readings from Last 3 Encounters:  05/01/24 210 lb (95.3 kg)  09/08/23 208 lb (94.3 kg)  12/09/22 208 lb 8 oz (94.6 kg)      Physical Exam Vitals and nursing note reviewed.  Constitutional:      Appearance: Normal appearance.   Cardiovascular:     Heart sounds: Normal heart sounds.  Pulmonary:     Breath sounds: Normal breath sounds.   Psychiatric:        Mood and Affect: Mood normal.        Behavior: Behavior normal.      No results found for any visits on 05/01/24.  Last CBC Lab Results  Component Value Date   WBC 8.2 12/10/2022   HGB  15.1 12/10/2022   HCT 44.2 12/10/2022   MCV 91 12/10/2022   MCH 31.0 12/10/2022   RDW 13.2 12/10/2022   PLT 295 12/10/2022   Last metabolic panel Lab Results  Component Value Date   GLUCOSE 108 (H) 12/10/2022   NA 140 12/10/2022   K 4.8 12/10/2022   CL 103 12/10/2022   CO2 19 (L) 12/10/2022   BUN 13 12/10/2022   CREATININE 0.79 12/10/2022   EGFR 104 12/10/2022   CALCIUM 9.3 12/10/2022   PROT 7.4 10/07/2021   ALBUMIN 4.9 10/07/2021   BILITOT 0.5 10/07/2021   ALKPHOS 60 10/07/2021   AST 25 10/07/2021   ALT 30 10/07/2021   ANIONGAP 9 04/25/2020   Last lipids Lab Results  Component Value Date   CHOL 247 (H) 12/10/2022   HDL 48 12/10/2022   LDLCALC 153 (H) 12/10/2022   TRIG 252 (H) 12/10/2022   CHOLHDL 5.1 (H) 12/10/2022      The 10-year ASCVD risk score (Arnett DK, et al., 2019) is: 10.7%    Assessment & Plan:   Problem List Items Addressed This Visit       Cardiovascular and Mediastinum   Essential hypertension - Primary (Chronic)   Very mildly elevated here but reassured that was at goal at  recent visit.  Continue Coreg 3.125 BID        Respiratory   Allergic rhinitis   Discussed use on OTC antihistamines and intransal corticosteroids.        Other   Mixed hyperlipidemia   10 year ASCVD risk about 10% not interested in statin currently.      Generalized anxiety disorder   Has used klonopin  exceedingly rarely, refilled 30 tablet rx.      Elevated ALT measurement   Mildly elevated at 57, does not drink etoh for last few years, will plan to recheck with next labwork. If persistently elevated will recheck viral serologies and US  for fatty liver.       Return in about 6 months (around 10/31/2024).    Ian Brooms, DO

## 2024-05-05 NOTE — Assessment & Plan Note (Signed)
 Has used klonopin  exceedingly rarely, refilled 30 tablet rx.

## 2024-05-05 NOTE — Assessment & Plan Note (Signed)
 10 year ASCVD risk about 10% not interested in statin currently.

## 2024-06-27 DIAGNOSIS — Z1283 Encounter for screening for malignant neoplasm of skin: Secondary | ICD-10-CM | POA: Diagnosis not present

## 2024-06-27 DIAGNOSIS — L821 Other seborrheic keratosis: Secondary | ICD-10-CM | POA: Diagnosis not present

## 2024-06-27 DIAGNOSIS — D225 Melanocytic nevi of trunk: Secondary | ICD-10-CM | POA: Diagnosis not present

## 2024-06-27 DIAGNOSIS — L814 Other melanin hyperpigmentation: Secondary | ICD-10-CM | POA: Diagnosis not present

## 2024-08-15 ENCOUNTER — Other Ambulatory Visit: Payer: Self-pay | Admitting: Physician Assistant

## 2024-08-15 DIAGNOSIS — M533 Sacrococcygeal disorders, not elsewhere classified: Secondary | ICD-10-CM

## 2024-08-30 ENCOUNTER — Inpatient Hospital Stay: Admission: RE | Admit: 2024-08-30 | Source: Ambulatory Visit
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
## 2024-09-13 ENCOUNTER — Ambulatory Visit (HOSPITAL_COMMUNITY): Attending: Orthopedic Surgery

## 2024-09-13 ENCOUNTER — Encounter (HOSPITAL_COMMUNITY): Payer: Self-pay

## 2024-09-13 ENCOUNTER — Other Ambulatory Visit: Payer: Self-pay

## 2024-09-13 DIAGNOSIS — Z7409 Other reduced mobility: Secondary | ICD-10-CM | POA: Diagnosis present

## 2024-09-13 DIAGNOSIS — M533 Sacrococcygeal disorders, not elsewhere classified: Secondary | ICD-10-CM | POA: Insufficient documentation

## 2024-09-13 DIAGNOSIS — M5459 Other low back pain: Secondary | ICD-10-CM | POA: Diagnosis present

## 2024-09-13 DIAGNOSIS — G8929 Other chronic pain: Secondary | ICD-10-CM | POA: Diagnosis present

## 2024-09-14 ENCOUNTER — Other Ambulatory Visit

## 2024-10-03 ENCOUNTER — Ambulatory Visit (HOSPITAL_COMMUNITY): Attending: Orthopedic Surgery | Admitting: Physical Therapy

## 2024-10-03 DIAGNOSIS — G8929 Other chronic pain: Secondary | ICD-10-CM | POA: Insufficient documentation

## 2024-10-03 DIAGNOSIS — M533 Sacrococcygeal disorders, not elsewhere classified: Secondary | ICD-10-CM | POA: Insufficient documentation

## 2024-10-03 DIAGNOSIS — Z7409 Other reduced mobility: Secondary | ICD-10-CM | POA: Diagnosis present

## 2024-10-03 DIAGNOSIS — M5459 Other low back pain: Secondary | ICD-10-CM | POA: Insufficient documentation
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
## 2024-10-05 ENCOUNTER — Ambulatory Visit (HOSPITAL_COMMUNITY)

## 2024-10-05 DIAGNOSIS — Z7409 Other reduced mobility: Secondary | ICD-10-CM

## 2024-10-05 DIAGNOSIS — M5459 Other low back pain: Secondary | ICD-10-CM | POA: Diagnosis not present

## 2024-10-05 DIAGNOSIS — G8929 Other chronic pain: Secondary | ICD-10-CM
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
## 2024-10-11 ENCOUNTER — Ambulatory Visit (HOSPITAL_COMMUNITY)

## 2024-10-13 ENCOUNTER — Ambulatory Visit (HOSPITAL_COMMUNITY)

## 2024-10-17 ENCOUNTER — Encounter (HOSPITAL_COMMUNITY): Payer: Self-pay

## 2024-10-17 ENCOUNTER — Ambulatory Visit (HOSPITAL_COMMUNITY)

## 2024-10-17 DIAGNOSIS — G8929 Other chronic pain: Secondary | ICD-10-CM

## 2024-10-17 DIAGNOSIS — Z7409 Other reduced mobility: Secondary | ICD-10-CM

## 2024-10-17 DIAGNOSIS — M5459 Other low back pain: Secondary | ICD-10-CM | POA: Diagnosis not present
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
## 2024-10-24 ENCOUNTER — Encounter (HOSPITAL_COMMUNITY): Payer: Self-pay

## 2024-10-24 ENCOUNTER — Ambulatory Visit (HOSPITAL_COMMUNITY)

## 2024-10-26 ENCOUNTER — Ambulatory Visit (HOSPITAL_COMMUNITY): Admitting: Physical Therapy

## 2024-10-26 ENCOUNTER — Telehealth (HOSPITAL_COMMUNITY): Payer: Self-pay | Admitting: Physical Therapy

## 2024-10-26 NOTE — Telephone Encounter (Signed)
 Pt did not show for appt.  Called and spoke to pt who states he thought his appt was another day . Reminded of next date/time for scheduled appt.    Greig KATHEE Fuse, PTA/CLT [facility] Ph: [phone]

## 2024-10-31 ENCOUNTER — Ambulatory Visit (HOSPITAL_COMMUNITY)

## 2024-11-07 ENCOUNTER — Ambulatory Visit (HOSPITAL_COMMUNITY): Attending: Orthopedic Surgery

## 2024-11-07 ENCOUNTER — Encounter (HOSPITAL_COMMUNITY): Payer: Self-pay

## 2024-11-07 DIAGNOSIS — Z7409 Other reduced mobility: Secondary | ICD-10-CM | POA: Insufficient documentation

## 2024-11-07 DIAGNOSIS — G8929 Other chronic pain: Secondary | ICD-10-CM

## 2024-11-07 DIAGNOSIS — M5459 Other low back pain: Secondary | ICD-10-CM

## 2024-11-07 DIAGNOSIS — M533 Sacrococcygeal disorders, not elsewhere classified: Secondary | ICD-10-CM | POA: Diagnosis present
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
## 2024-11-14 ENCOUNTER — Ambulatory Visit (HOSPITAL_COMMUNITY): Admitting: Physical Therapy

## 2024-11-14 DIAGNOSIS — M5459 Other low back pain: Secondary | ICD-10-CM

## 2024-11-14 DIAGNOSIS — Z7409 Other reduced mobility: Secondary | ICD-10-CM

## 2024-11-14 DIAGNOSIS — G8929 Other chronic pain: Secondary | ICD-10-CM
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
## 2024-11-20 ENCOUNTER — Ambulatory Visit (HOSPITAL_COMMUNITY)

## 2024-11-24 ENCOUNTER — Ambulatory Visit (HOSPITAL_COMMUNITY): Attending: Orthopedic Surgery

## 2024-11-24 ENCOUNTER — Encounter (HOSPITAL_COMMUNITY): Payer: Self-pay

## 2024-11-24 DIAGNOSIS — G8929 Other chronic pain: Secondary | ICD-10-CM | POA: Insufficient documentation

## 2024-11-24 DIAGNOSIS — M533 Sacrococcygeal disorders, not elsewhere classified: Secondary | ICD-10-CM | POA: Insufficient documentation

## 2024-11-24 DIAGNOSIS — M5459 Other low back pain: Secondary | ICD-10-CM | POA: Insufficient documentation

## 2024-11-24 DIAGNOSIS — Z7409 Other reduced mobility: Secondary | ICD-10-CM | POA: Insufficient documentation
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
## 2024-11-28 ENCOUNTER — Encounter (HOSPITAL_COMMUNITY): Payer: Self-pay

## 2024-11-28 DIAGNOSIS — Z7409 Other reduced mobility: Secondary | ICD-10-CM

## 2024-11-28 DIAGNOSIS — M5459 Other low back pain: Secondary | ICD-10-CM

## 2024-11-28 DIAGNOSIS — G8929 Other chronic pain: Secondary | ICD-10-CM
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
## 2025-01-10 ENCOUNTER — Ambulatory Visit: Payer: Self-pay
